# Patient Record
Sex: Female | Born: 1985 | Race: Black or African American | Hispanic: No | Marital: Single | Smoking: Former smoker
Health system: Southern US, Community
[De-identification: ages and names within clinical notes are randomized; demographics above are authoritative.]

## PROBLEM LIST (undated history)

## (undated) DIAGNOSIS — N730 Acute parametritis and pelvic cellulitis: Secondary | ICD-10-CM

## (undated) DIAGNOSIS — J45909 Unspecified asthma, uncomplicated: Secondary | ICD-10-CM

## (undated) DIAGNOSIS — D573 Sickle-cell trait: Secondary | ICD-10-CM

---

## 2017-04-07 DIAGNOSIS — J1089 Influenza due to other identified influenza virus with other manifestations: Secondary | ICD-10-CM | POA: Diagnosis not present

## 2018-03-01 ENCOUNTER — Ambulatory Visit (INDEPENDENT_AMBULATORY_CARE_PROVIDER_SITE_OTHER): Payer: Self-pay

## 2018-03-01 ENCOUNTER — Encounter (HOSPITAL_COMMUNITY): Payer: Self-pay | Admitting: Emergency Medicine

## 2018-03-01 ENCOUNTER — Ambulatory Visit (HOSPITAL_COMMUNITY)
Admission: EM | Admit: 2018-03-01 | Discharge: 2018-03-01 | Disposition: A | Discharge status: Home / Self Care | Payer: Medicaid Other | Attending: Family Medicine | Admitting: Family Medicine

## 2018-03-01 DIAGNOSIS — R0781 Pleurodynia: Secondary | ICD-10-CM

## 2018-03-01 HISTORY — DX: Unspecified asthma, uncomplicated: J45.909

## 2018-03-01 HISTORY — DX: Acute parametritis and pelvic cellulitis: N73.0

## 2018-03-01 HISTORY — DX: Sickle-cell trait: D57.3

## 2018-03-01 MED ORDER — NAPROXEN 375 MG PO TABS: 375.0000 mg | ORAL_TABLET | Freq: Two times a day (BID) | ORAL | 0 refills | Status: DC

## 2018-03-01 NOTE — ED Provider Notes (Signed)
[facility]   [id number] 03/01/18 Arrival Time: 1700  CC: Left sided rib pain  SUBJECTIVE: History from: patient. Shalondra Hisey is a 33 y.o. female complains of left sided rib pain that began a few weeks ago.  Denies a precipitating event or specific injury.  Denies recent cold symptoms or persistent cough.  Works in house keeping, and admits to repetitive movements.  Localizes the pain to the left lateral ribs.  Describes the pain as intermittent.  Has tried OTC medications without relief.  Symptoms are made worse at night, with deep inspiration, and cough.  Admits to mild productive cough at night.  Denies similar symptoms in the past.  Denies fever, chills, nausea, vomiting, SOB, calf pain/swelling, hormone use, recent long travel, hx of blood clot, hx of malignancy, hemoptysis, abdominal pain, changes in bowel or bladder habits, erythema, ecchymosis, effusion, weakness, numbness and tingling.      Does admit to occasional tobacco and marijuana use  ROS: As per HPI.  Past Medical History:  Diagnosis Date  . Asthma   . PID (acute pelvic inflammatory disease)   . Sickle cell trait (HCC)    History reviewed. No pertinent surgical history. Allergies  Allergen Reactions  . Penicillins    No current facility-administered medications on file prior to encounter.    No current outpatient medications on file prior to encounter.   Social History   Socioeconomic History  . Marital status: Single    Spouse name: Not on file  . Number of children: Not on file  . Years of education: Not on file  . Highest education level: Not on file  Occupational History  . Not on file  Social Needs  . Financial resource strain: Not on file  . Food insecurity:    Worry: Not on file    Inability: Not on file  . Transportation needs:    Medical: Not on file    Non-medical: Not on file  Tobacco Use  . Smoking status: Current Every Day Smoker  . Smokeless tobacco: Never Used  Substance  and Sexual Activity  . Alcohol use: Not Currently  . Drug use: Never  . Sexual activity: Not on file  Lifestyle  . Physical activity:    Days per week: Not on file    Minutes per session: Not on file  . Stress: Not on file  Relationships  . Social connections:    Talks on phone: Not on file    Gets together: Not on file    Attends religious service: Not on file    Active member of club or organization: Not on file    Attends meetings of clubs or organizations: Not on file    Relationship status: Not on file  . Intimate partner violence:    Fear of current or ex partner: Not on file    Emotionally abused: Not on file    Physically abused: Not on file    Forced sexual activity: Not on file  Other Topics Concern  . Not on file  Social History Narrative  . Not on file   Family History  Problem Relation Age of Onset  . Sickle cell anemia Mother   . CAD Mother     OBJECTIVE:  Vitals:   03/01/18 1029  BP: 126/85  Pulse: 60  Resp: 18  Temp: 98.1 F (36.7 C)  TempSrc: Temporal  SpO2: 100%    General appearance: AOx3; in no acute distress.  Head: NCAT Lungs: CTA bilaterally Heart:  RRR.  Musculoskeletal: Left ribs Inspection: Skin warm, dry, clear and intact without obvious erythema, effusion, or ecchymosis.  Palpation: NTTP; admits to symptoms with deep breath in between left lateral ribs; unable to palpate tenderness on exam Abdomen: soft, nondistended, normal active bowel sounds; nontender to palpation; no guarding Skin: warm and dry Neurologic: Ambulates without difficulty; Sensation intact about the upper/ lower extremities Psychological: alert and cooperative; normal mood and affect  DIAGNOSTIC STUDIES:  Dg Ribs Unilateral W/chest Left  Result Date: 03/01/2018 CLINICAL DATA:  Increasing left chest pain over the past month. Shortness of breath. EXAM: LEFT RIBS AND CHEST - 3+ VIEW COMPARISON:  None. FINDINGS: The lungs are clear. No pneumothorax or pleural  effusion. Heart size is normal. No rib fracture or other bony abnormality is identified. IMPRESSION: Normal exam. Electronically Signed   By: Drusilla Kanner M.D.   On: 03/01/2018 11:36     ASSESSMENT & PLAN:  1. Rib pain on left side     Meds ordered this encounter  Medications  . naproxen (NAPROSYN) 375 MG tablet    Sig: Take 1 tablet (375 mg total) by mouth 2 (two) times daily.    Dispense:  20 tablet    Refill:  0    Order Specific Question:   Supervising Provider    Answer:   Eustace Moore [[id number]]   X-rays did not show rib fracture, pneumonia, or fluid in the lungs.   Continue conservative management of rest, ice, and gentle stretches Take naproxen as needed for pain relief (may cause abdominal discomfort, ulcers, and GI bleeds avoid taking with other NSAIDs) Follow up with PCP in 1-2 weeks for reevaluation Return or go to the ER if you have any new or worsening symptoms (fever, chills, chest pain, abdominal pain, changes in bowel or bladder habits, pain radiating into lower legs, etc...)   Reviewed expectations re: course of current medical issues. Questions answered. Outlined signs and symptoms indicating need for more acute intervention. Patient verbalized understanding. After Visit Summary given.    Rennis Harding, PA-C 03/01/18 1241

## 2018-03-01 NOTE — ED Notes (Signed)
Patient able to ambulate independently  

## 2018-03-01 NOTE — ED Triage Notes (Signed)
Pt here for pain in left rib area x 1 month worse with inspiration and cough

## 2018-03-01 NOTE — Discharge Instructions (Signed)
X-rays did not show rib fracture, pneumonia, or fluid in the lungs.   Continue conservative management of rest, ice, and gentle stretches Take naproxen as needed for pain relief (may cause abdominal discomfort, ulcers, and GI bleeds avoid taking with other NSAIDs) Follow up with PCP in 1-2 weeks for reevaluation Return or go to the ER if you have any new or worsening symptoms (fever, chills, chest pain, abdominal pain, changes in bowel or bladder habits, pain radiating into lower legs, etc...)

## 2018-03-15 ENCOUNTER — Encounter (HOSPITAL_COMMUNITY): Payer: Self-pay | Admitting: Emergency Medicine

## 2018-03-15 ENCOUNTER — Ambulatory Visit (HOSPITAL_COMMUNITY)
Admission: EM | Admit: 2018-03-15 | Discharge: 2018-03-15 | Disposition: A | Discharge status: Home / Self Care | Payer: Self-pay | Attending: Emergency Medicine | Admitting: Emergency Medicine

## 2018-03-15 ENCOUNTER — Ambulatory Visit (INDEPENDENT_AMBULATORY_CARE_PROVIDER_SITE_OTHER): Payer: Self-pay

## 2018-03-15 DIAGNOSIS — R05 Cough: Secondary | ICD-10-CM

## 2018-03-15 DIAGNOSIS — B9789 Other viral agents as the cause of diseases classified elsewhere: Secondary | ICD-10-CM

## 2018-03-15 DIAGNOSIS — J069 Acute upper respiratory infection, unspecified: Secondary | ICD-10-CM

## 2018-03-15 DIAGNOSIS — J209 Acute bronchitis, unspecified: Secondary | ICD-10-CM

## 2018-03-15 MED ORDER — PREDNISONE 20 MG PO TABS: 20.0000 mg | ORAL_TABLET | Freq: Two times a day (BID) | ORAL | 0 refills | Status: AC

## 2018-03-15 MED ORDER — CETIRIZINE-PSEUDOEPHEDRINE ER 5-120 MG PO TB12: 1.0000 | ORAL_TABLET | Freq: Every day | ORAL | 0 refills | Status: DC

## 2018-03-15 MED ORDER — BENZONATATE 100 MG PO CAPS: 100.0000 mg | ORAL_CAPSULE | Freq: Three times a day (TID) | ORAL | 0 refills | Status: DC

## 2018-03-15 MED ORDER — FLUTICASONE PROPIONATE 50 MCG/ACT NA SUSP: 2.0000 | Freq: Every day | NASAL | 0 refills | Status: DC

## 2018-03-15 NOTE — Discharge Instructions (Addendum)
X-rays did not show signs of cardiopulmonary disease, however, based on symptoms and physical exam findings I will treat you for potential viral bronchitis Get plenty of rest and push fluids Prednisone prescribed.  Take as directed and to completion Tessalon Perles prescribed for cough Zyrtec-D prescribed for nasal congestion, runny nose, and/or sore throat Flonase prescribed for nasal congestion and runny nose Use medications daily for symptom relief Use OTC medications like ibuprofen or tylenol as needed fever or pain Follow up with PCP or [facility] if symptoms persist Return or go to ER if you have any new or worsening symptoms fever, chills, nausea, vomiting, chest pain, cough, shortness of breath, wheezing, abdominal pain, changes in bowel or bladder habits, etc..Marland Kitchen

## 2018-03-15 NOTE — ED Provider Notes (Signed)
[facility]   [id number] 03/15/18 Arrival Time: 0831   CC: URI symptoms   SUBJECTIVE: History from: patient.  Jacqueline Schmidt is a 33 y.o. female who presents with abrupt onset of runny nose, cough, and chills x few days.  Admits to sick exposure to daughter with runny nose.  Has tried OTC medications without relief.  Denies previous symptoms in the past.   Denies fever, fatigue, sinus pain, sore throat, SOB, wheezing, chest pain, nausea, changes in bowel or bladder habits.    Received flu shot this year: no.  ROS: As per HPI.  Past Medical History:  Diagnosis Date  . Asthma   . PID (acute pelvic inflammatory disease)   . Sickle cell trait (HCC)    History reviewed. No pertinent surgical history. Allergies  Allergen Reactions  . Penicillins    No current facility-administered medications on file prior to encounter.    Current Outpatient Medications on File Prior to Encounter  Medication Sig Dispense Refill  . naproxen (NAPROSYN) 375 MG tablet Take 1 tablet (375 mg total) by mouth 2 (two) times daily. 20 tablet 0   Social History   Socioeconomic History  . Marital status: Single    Spouse name: Not on file  . Number of children: Not on file  . Years of education: Not on file  . Highest education level: Not on file  Occupational History  . Not on file  Social Needs  . Financial resource strain: Not on file  . Food insecurity:    Worry: Not on file    Inability: Not on file  . Transportation needs:    Medical: Not on file    Non-medical: Not on file  Tobacco Use  . Smoking status: Current Every Day Smoker  . Smokeless tobacco: Never Used  Substance and Sexual Activity  . Alcohol use: Not Currently  . Drug use: Never  . Sexual activity: Not on file  Lifestyle  . Physical activity:    Days per week: Not on file    Minutes per session: Not on file  . Stress: Not on file  Relationships  . Social connections:    Talks on phone: Not on file    Gets  together: Not on file    Attends religious service: Not on file    Active member of club or organization: Not on file    Attends meetings of clubs or organizations: Not on file    Relationship status: Not on file  . Intimate partner violence:    Fear of current or ex partner: Not on file    Emotionally abused: Not on file    Physically abused: Not on file    Forced sexual activity: Not on file  Other Topics Concern  . Not on file  Social History Narrative  . Not on file   Family History  Problem Relation Age of Onset  . Sickle cell anemia Mother   . CAD Mother     OBJECTIVE:  Vitals:   03/15/18 0905  BP: 135/88  Pulse: 85  Resp: 16  Temp: 99.7 F (37.6 C)  TempSrc: Oral  SpO2: 100%     General appearance: alert; appears mildly fatigued, but nontoxic; speaking in full sentences and tolerating own secretions HEENT: NCAT; Ears: EACs clear, TMs pearly gray; Eyes: PERRL.  EOM grossly intact. Nose: nares patent without rhinorrhea, Throat: oropharynx clear, tonsils non erythematous or enlarged, uvula midline  Neck: supple without LAD Lungs: Rhonchi/wheezes heard throughout bilateral lungs;  mild cough Heart: regular rate and rhythm.  Radial pulses 2+ symmetrical bilaterally Skin: warm and dry Psychological: alert and cooperative; normal mood and affect  DIAGNOSTIC STUDIES:  Dg Chest 2 View  Result Date: 03/15/2018 CLINICAL DATA:  Cough and fever EXAM: CHEST - 2 VIEW COMPARISON:  03/01/2018 FINDINGS: Heart size is normal. Mediastinal shadows are normal. The lungs are clear. No bronchial thickening. No infiltrate, mass, effusion or collapse. Pulmonary vascularity is normal. No bony abnormality. IMPRESSION: Normal chest Electronically Signed   By: Paulina Fusi M.D.   On: 03/15/2018 10:28    ASSESSMENT & PLAN:  1. Acute bronchitis, unspecified organism   2. Viral URI with cough     Meds ordered this encounter  Medications  . predniSONE (DELTASONE) 20 MG tablet    Sig:  Take 1 tablet (20 mg total) by mouth 2 (two) times daily with a meal for 5 days.    Dispense:  10 tablet    Refill:  0    Order Specific Question:   Supervising Provider    Answer:   Eustace Moore [[id number]]  . cetirizine-pseudoephedrine (ZYRTEC-D) 5-120 MG tablet    Sig: Take 1 tablet by mouth daily.    Dispense:  30 tablet    Refill:  0    Order Specific Question:   Supervising Provider    Answer:   Eustace Moore [[id number]]  . fluticasone (FLONASE) 50 MCG/ACT nasal spray    Sig: Place 2 sprays into both nostrils daily.    Dispense:  16 g    Refill:  0    Order Specific Question:   Supervising Provider    Answer:   Eustace Moore [[id number]]  . benzonatate (TESSALON) 100 MG capsule    Sig: Take 1 capsule (100 mg total) by mouth every 8 (eight) hours.    Dispense:  21 capsule    Refill:  0    Order Specific Question:   Supervising Provider    Answer:   Eustace Moore [[id number]]   X-rays did not show signs of cardiopulmonary disease, however, based on symptoms and physical exam findings I will treat you for potential viral bronchitis Get plenty of rest and push fluids Prednisone prescribed.  Take as directed and to completion Tessalon Perles prescribed for cough Zyrtec-D prescribed for nasal congestion, runny nose, and/or sore throat Flonase prescribed for nasal congestion and runny nose Use medications daily for symptom relief Use OTC medications like ibuprofen or tylenol as needed fever or pain Follow up with PCP or [facility] if symptoms persist Return or go to ER if you have any new or worsening symptoms fever, chills, nausea, vomiting, chest pain, cough, shortness of breath, wheezing, abdominal pain, changes in bowel or bladder habits, etc...  Reviewed expectations re: course of current medical issues. Questions answered. Outlined signs and symptoms indicating need for more acute intervention. Patient verbalized understanding. After Visit Summary  given.         Rennis Harding, PA-C 03/15/18 1249

## 2018-03-15 NOTE — ED Notes (Signed)
Patient able to ambulate independently  

## 2018-03-15 NOTE — ED Triage Notes (Signed)
Pt presents to [facility] for assessment of left rib pain x 2 weeks (seen previously for the same) and also achy bones, cough, congestion, and low-grade fevers x 3 days.

## 2020-07-08 ENCOUNTER — Ambulatory Visit: Payer: Self-pay

## 2020-07-08 NOTE — Telephone Encounter (Signed)
Pt. Tested positive for COVID 19 with home test today. Symptoms started 07/05/20. Reviewed home treatment and quarantine recommendations. Will try e-visit through [facility].  Reason for Disposition . [1] [id number] diagnosed by positive lab test (e.g., PCR, rapid self-test kit) AND [2] mild symptoms (e.g., cough, fever, others) AND [9] no complications or SOB  Answer Assessment - Initial Assessment Questions 1. COVID-19 DIAGNOSIS: "Who made your COVID-19 diagnosis?" "Was it confirmed by a positive lab test or self-test?" If not diagnosed by a doctor (or NP/PA), ask "Are there lots of cases (community spread) where you live?" Note: See public health department website, if unsure.     Home 2. COVID-19 EXPOSURE: "Was there any known exposure to COVID before the symptoms began?" CDC Definition of close contact: within 6 feet (2 meters) for a total of 15 minutes or more over a 24-hour period.      No 3. ONSET: "When did the COVID-19 symptoms start?"      07/05/20 4. WORST SYMPTOM: "What is your worst symptom?" (e.g., cough, fever, shortness of breath, muscle aches)     Cough 5. COUGH: "Do you have a cough?" If Yes, ask: "How bad is the cough?"       Yes 6. FEVER: "Do you have a fever?" If Yes, ask: "What is your temperature, how was it measured, and when did it start?"     No 7. RESPIRATORY STATUS: "Describe your breathing?" (e.g., shortness of breath, wheezing, unable to speak)      No 8. BETTER-SAME-WORSE: "Are you getting better, staying the same or getting worse compared to yesterday?"  If getting worse, ask, "In what way?"     Better 9. HIGH RISK DISEASE: "Do you have any chronic medical problems?" (e.g., asthma, heart or lung disease, weak immune system, obesity, etc.)     Asthma 10. VACCINE: "Have you had the COVID-19 vaccine?" If Yes, ask: "Which one, how many shots, when did you get it?"       Yes 11. BOOSTER: "Have you received your COVID-19 booster?" If Yes, ask: "Which one and when did  you get it?"       Yes 12. PREGNANCY: "Is there any chance you are pregnant?" "When was your last menstrual period?"       No 13. OTHER SYMPTOMS: "Do you have any other symptoms?"  (e.g., chills, fatigue, headache, loss of smell or taste, muscle pain, sore throat)       Diarrhea 14. O2 SATURATION MONITOR:  "Do you use an oxygen saturation monitor (pulse oximeter) at home?" If Yes, ask "What is your reading (oxygen level) today?" "What is your usual oxygen saturation reading?" (e.g., 95%)       No  Protocols used: CORONAVIRUS (COVID-19) DIAGNOSED OR SUSPECTED-A-AH

## 2020-07-29 ENCOUNTER — Encounter (HOSPITAL_COMMUNITY): Payer: Self-pay | Admitting: *Deleted

## 2020-07-29 ENCOUNTER — Encounter (HOSPITAL_COMMUNITY): Payer: Self-pay

## 2020-07-29 ENCOUNTER — Other Ambulatory Visit: Payer: Self-pay

## 2020-07-29 ENCOUNTER — Ambulatory Visit (HOSPITAL_COMMUNITY)
Admission: EM | Admit: 2020-07-29 | Discharge: 2020-07-29 | Disposition: A | Discharge status: Home / Self Care | Payer: Medicaid Other

## 2020-07-29 ENCOUNTER — Emergency Department (HOSPITAL_COMMUNITY)
Admission: EM | Admit: 2020-07-29 | Discharge: 2020-07-29 | Disposition: A | Discharge status: Home / Self Care | Payer: Medicaid Other | Attending: Emergency Medicine | Admitting: Emergency Medicine

## 2020-07-29 DIAGNOSIS — R1013 Epigastric pain: Secondary | ICD-10-CM | POA: Diagnosis not present

## 2020-07-29 DIAGNOSIS — R1110 Vomiting, unspecified: Secondary | ICD-10-CM | POA: Insufficient documentation

## 2020-07-29 DIAGNOSIS — R109 Unspecified abdominal pain: Secondary | ICD-10-CM | POA: Insufficient documentation

## 2020-07-29 DIAGNOSIS — R197 Diarrhea, unspecified: Secondary | ICD-10-CM

## 2020-07-29 DIAGNOSIS — R112 Nausea with vomiting, unspecified: Secondary | ICD-10-CM

## 2020-07-29 DIAGNOSIS — Z5321 Procedure and treatment not carried out due to patient leaving prior to being seen by health care provider: Secondary | ICD-10-CM | POA: Insufficient documentation

## 2020-07-29 LAB — POCT URINALYSIS DIPSTICK, ED / UC
Bilirubin Urine: NEGATIVE
Glucose, UA: NEGATIVE mg/dL
Leukocytes,Ua: NEGATIVE
Nitrite: NEGATIVE
Protein, ur: 30 mg/dL — AB
Specific Gravity, Urine: >=1.03 (ref 1.005–1.030)
Urobilinogen, UA: 0.2 mg/dL (ref 0.0–1.0)
pH: 5.5 (ref 5.0–8.0)

## 2020-07-29 LAB — CBC WITH DIFFERENTIAL/PLATELET
Abs Immature Granulocytes: 0.03 10*3/uL (ref 0.00–0.07)
Basophils Absolute: 0 10*3/uL (ref 0.0–0.1)
Basophils Relative: 0 %
Eosinophils Absolute: 0 10*3/uL (ref 0.0–0.5)
Eosinophils Relative: 0 %
HCT: 40.2 % (ref 36.0–46.0)
Hemoglobin: 13.4 g/dL (ref 12.0–15.0)
Immature Granulocytes: 0 %
Lymphocytes Relative: 16 %
Lymphs Abs: 1.8 10*3/uL (ref 0.7–4.0)
MCH: 26.8 pg (ref 26.0–34.0)
MCHC: 33.3 g/dL (ref 30.0–36.0)
MCV: 80.4 fL (ref 80.0–100.0)
Monocytes Absolute: 0.3 10*3/uL (ref 0.1–1.0)
Monocytes Relative: 3 %
Neutro Abs: 9 10*3/uL — ABNORMAL HIGH (ref 1.7–7.7)
Neutrophils Relative %: 81 %
Platelets: 774 10*3/uL — ABNORMAL HIGH (ref 150–400)
RBC: 5 MIL/uL (ref 3.87–5.11)
RDW: 14.6 % (ref 11.5–15.5)
WBC: 11.1 10*3/uL — ABNORMAL HIGH (ref 4.0–10.5)
nRBC: 0 % (ref 0.0–0.2)

## 2020-07-29 LAB — COMPREHENSIVE METABOLIC PANEL
ALT: 13 U/L (ref 0–44)
AST: 20 U/L (ref 15–41)
Albumin: 4 g/dL (ref 3.5–5.0)
Alkaline Phosphatase: 37 U/L — ABNORMAL LOW (ref 38–126)
Anion gap: 17 — ABNORMAL HIGH (ref 5–15)
BUN: 13 mg/dL (ref 6–20)
CO2: 21 mmol/L — ABNORMAL LOW (ref 22–32)
Calcium: 9.6 mg/dL (ref 8.9–10.3)
Chloride: 103 mmol/L (ref 98–111)
Creatinine, Ser: 0.63 mg/dL (ref 0.44–1.00)
GFR, Estimated: >60 mL/min (ref >60)
Glucose, Bld: 142 mg/dL — ABNORMAL HIGH (ref 70–99)
Potassium: 3.5 mmol/L (ref 3.5–5.1)
Sodium: 141 mmol/L (ref 135–145)
Total Bilirubin: 0.6 mg/dL (ref 0.3–1.2)
Total Protein: 7.7 g/dL (ref 6.5–8.1)

## 2020-07-29 LAB — I-STAT BETA HCG BLOOD, ED (MC, WL, AP ONLY): I-stat hCG, quantitative: <5 m[IU]/mL (ref <5)

## 2020-07-29 LAB — LIPASE, BLOOD: Lipase: 27 U/L (ref 11–51)

## 2020-07-29 MED ORDER — DICYCLOMINE HCL 10 MG/5ML PO SOLN: 10.0000 mg | Freq: Once | ORAL | Status: DC

## 2020-07-29 MED ORDER — ALUM & MAG HYDROXIDE-SIMETH 200-200-20 MG/5ML PO SUSP: 30.0000 mL | Freq: Once | ORAL | Status: DC

## 2020-07-29 MED ORDER — ONDANSETRON 4 MG PO TBDP
4.0000 mg | ORAL_TABLET | Freq: Once | ORAL | Status: AC
  Administered 2020-07-29: 4 mg via ORAL
  Filled 2020-07-29: qty 1

## 2020-07-29 MED ORDER — METOCLOPRAMIDE HCL 5 MG/ML IJ SOLN
10.0000 mg | Freq: Once | INTRAMUSCULAR | Status: AC
  Administered 2020-07-29: 10 mg via INTRAMUSCULAR

## 2020-07-29 NOTE — Discharge Instructions (Addendum)
Go to ED with new or worsening symptoms. Follow up with PCP for referral to hematology

## 2020-07-29 NOTE — ED Notes (Signed)
Pt called 3x no answer  

## 2020-07-29 NOTE — ED Provider Notes (Addendum)
[facility]    CSN: [id number] Arrival date & time: 07/29/20  0907      History   Chief Complaint Chief Complaint  Patient presents with   Abdominal Pain   Emesis   dark stools    HPI Jacqueline Schmidt is a 35 y.o. female.   Patient here c/w vomiting x 1 day.  She went to ED Last night but LWBS due to wait time.  She states pain is worse, 10/10 epigastric pain, does not radiate.  PMH thrombocytosis, platelets at ED last night 774.  She does not see hematology.  Admits diarrhea, some dark stools.  Denies dysuria, frequency, urgency, vaginal d/c.  ED did hcg last night which was negative.   Past Medical History:  Diagnosis Date   Asthma    PID (acute pelvic inflammatory disease)    Sickle cell trait (HCC)     There are no problems to display for this patient.   History reviewed. No pertinent surgical history.  OB History   No obstetric history on file.      Home Medications    Prior to Admission medications   Medication Sig Start Date End Date Taking? Authorizing Provider  Acetaminophen (TYLENOL PO) Take by mouth.   Yes [provider]  benzonatate (TESSALON) 100 MG capsule Take 1 capsule (100 mg total) by mouth every 8 (eight) hours. 03/15/18   Wurst, [place], PA-C  cetirizine-pseudoephedrine (ZYRTEC-D) 5-120 MG tablet Take 1 tablet by mouth daily. 03/15/18   Wurst, [place], PA-C  fluticasone (FLONASE) 50 MCG/ACT nasal spray Place 2 sprays into both nostrils daily. 03/15/18   Wurst, [place], PA-C  naproxen (NAPROSYN) 375 MG tablet Take 1 tablet (375 mg total) by mouth 2 (two) times daily. 03/01/18   Rennis Harding, PA-C    Family History Family History  Problem Relation Age of Onset   Sickle cell anemia Mother    CAD Mother     Social History Social History   Tobacco Use   Smoking status: Every Day    Packs/day: 1.00    Pack years: 0.00    Types: Cigarettes   Smokeless tobacco: Never  Substance Use Topics   Alcohol use: Yes     Comment: reports bottle of liquor lasts the month   Drug use: Never     Allergies   Penicillins   Review of Systems Review of Systems  Constitutional:  Negative for chills, fatigue and fever.  Eyes:  Negative for pain and redness.  Respiratory:  Negative for cough, shortness of breath and wheezing.   Gastrointestinal:  Positive for abdominal pain, diarrhea, nausea and vomiting.  Genitourinary:  Negative for dysuria, frequency, hematuria, urgency, vaginal bleeding, vaginal discharge and vaginal pain.  Musculoskeletal:  Negative for arthralgias and myalgias.  Skin:  Negative for rash.  Neurological:  Negative for light-headedness and headaches.  Hematological:  Negative for adenopathy. Does not bruise/bleed easily.  Psychiatric/Behavioral:  Negative for confusion and sleep disturbance.     Physical Exam Triage Vital Signs ED Triage Vitals  Enc Vitals Group     BP 07/29/20 0934 (!) 160/78     Pulse Rate 07/29/20 0934 (!) 50     Resp 07/29/20 0934 20     Temp 07/29/20 0934 98.5 F (36.9 C)     Temp src --      SpO2 07/29/20 0934 98 %     Weight --      Height --      Head Circumference --  Peak Flow --      Pain Score 07/29/20 0930 10     Pain Loc --      Pain Edu? --      Excl. in GC? --    No data found.  Updated Vital Signs BP (!) 160/78   Pulse (!) 50   Temp 98.5 F (36.9 C)   Resp 20   LMP 07/26/2020   SpO2 98%   Visual Acuity Right Eye Distance:   Left Eye Distance:   Bilateral Distance:    Right Eye Near:   Left Eye Near:    Bilateral Near:     Physical Exam Vitals and nursing note reviewed.  Constitutional:      General: She is not in acute distress.    Appearance: She is well-developed. She is not ill-appearing.  HENT:     Head: Normocephalic and atraumatic.  Eyes:     Extraocular Movements: Extraocular movements intact.     Conjunctiva/sclera: Conjunctivae normal.  Cardiovascular:     Rate and Rhythm: Normal rate and regular rhythm.      Heart sounds: No murmur heard. Pulmonary:     Effort: Pulmonary effort is normal. No respiratory distress.     Breath sounds: Normal breath sounds.  Abdominal:     Palpations: Abdomen is soft.     Tenderness: There is abdominal tenderness in the epigastric area. There is no right CVA tenderness, left CVA tenderness or guarding. Negative signs include Murphy's sign and McBurney's sign.  Musculoskeletal:     Cervical back: Neck supple.  Skin:    General: Skin is warm and dry.  Neurological:     General: No focal deficit present.     Mental Status: She is alert and oriented to person, place, and time.     Motor: No weakness.     Gait: Gait normal.  Psychiatric:        Mood and Affect: Mood normal.        Behavior: Behavior normal.     UC Treatments / Results  Labs (all labs ordered are listed, but only abnormal results are displayed) Labs Reviewed  POCT URINALYSIS DIPSTICK, ED / UC - Abnormal; Notable for the following components:      Result Value   Ketones, ur TRACE (*)    Hgb urine dipstick MODERATE (*)    Protein, ur 30 (*)    All other components within normal limits    EKG   Radiology No results found.  Procedures Procedures (including critical care time)  Medications Ordered in UC Medications  metoCLOPramide (REGLAN) injection 10 mg (10 mg Intramuscular Given 07/29/20 1028)    Initial Impression / Assessment and Plan / UC Course  I have reviewed the triage vital signs and the nursing notes.  Pertinent labs & imaging results that were available during my care of the patient were reviewed by me and considered in my medical decision making (see chart for details).     Patient reports improvement after injection of reglan.   Drink clear liquids, small volumes all day long Eat crackers and toast, advance diet as tolerated Strict ED precautions provided - discussed returning to ED, however she stated she did not wish to go due to wait.  She stated she will  return to ED if sx don't improve or worsen. Final Clinical Impressions(s) / UC Diagnoses   Final diagnoses:  Nausea vomiting and diarrhea  Epigastric pain     Discharge Instructions  Go to ED with new or worsening symptoms. Follow up with PCP for referral to hematology     ED Prescriptions   None    PDMP not reviewed this encounter.   Evern Core, PA-C 07/29/20 1118    Evern Core, PA-C 07/29/20 1144

## 2020-07-29 NOTE — ED Provider Notes (Signed)
Emergency Medicine Provider Triage Evaluation Note  Jacqueline Schmidt , a 35 y.o. female  was evaluated in triage.  Pt complains of vomiting.  The patient endorses multiple episodes of vomiting and upper abdominal pain, onset today.  She has been feeling more constipated and generally weak.  Last bowel movement was earlier today.  She endorses chills and tactile fever.  No dysuria, vaginal bleeding, discharge, chest pain, shortness of breath.  She tested positive for COVID-19 on May 31.  No concerns for pregnancy.  Review of Systems  Positive: Vomiting, abdominal pain, weakness Negative: Dysuria, vaginal bleeding, vaginal discharge, chest pain, shortness of breath  Physical Exam  There were no vitals taken for this visit. Gen:   Awake, no distress, actively retching Resp:  Normal effort MSK:   Moves extremities without difficulty Other:  Tender to palpation in the epigastric region.  Abdomen is soft and nondistended.  Medical Decision Making  Medically screening exam initiated at 3:06 AM.  Appropriate orders placed.  Tenaya Hilyer was informed that the remainder of the evaluation will be completed by another provider, this initial triage assessment does not replace that evaluation, and the importance of remaining in the ED until their evaluation is complete.  Patient with abdominal pain and vomiting, onset today.  She is actively retching in triage.  Zofran administered.  Labs are pending.  She will require further work-up and evaluation in the emergency department.   Barkley Boards, PA-C 07/29/20 0324    Shon Baton, MD 07/29/20 [phone]

## 2020-07-29 NOTE — ED Triage Notes (Signed)
Pt reports abd pain with n/v. Denies diarrhea, states that she feels constipated. N/v noted at triage, given zofran.

## 2020-07-29 NOTE — ED Notes (Signed)
PT has been call several times for vitals also registration  and has not answered.

## 2020-07-29 NOTE — ED Triage Notes (Signed)
Pt reports severe abdominal pain starting yesterday with emesis. States since yesterday unable to keep down food or liquid. Reports dark stools.

## 2020-07-30 ENCOUNTER — Emergency Department (HOSPITAL_COMMUNITY)
Admission: EM | Admit: 2020-07-30 | Discharge: 2020-07-30 | Disposition: A | Discharge status: Home / Self Care | Payer: Medicaid Other | Attending: Emergency Medicine | Admitting: Emergency Medicine

## 2020-07-30 ENCOUNTER — Other Ambulatory Visit: Payer: Self-pay

## 2020-07-30 ENCOUNTER — Emergency Department (HOSPITAL_COMMUNITY): Payer: Medicaid Other

## 2020-07-30 ENCOUNTER — Encounter (HOSPITAL_COMMUNITY): Payer: Self-pay | Admitting: *Deleted

## 2020-07-30 DIAGNOSIS — R1084 Generalized abdominal pain: Secondary | ICD-10-CM | POA: Diagnosis not present

## 2020-07-30 DIAGNOSIS — D75839 Thrombocytosis, unspecified: Secondary | ICD-10-CM | POA: Insufficient documentation

## 2020-07-30 DIAGNOSIS — F17210 Nicotine dependence, cigarettes, uncomplicated: Secondary | ICD-10-CM | POA: Insufficient documentation

## 2020-07-30 DIAGNOSIS — R1013 Epigastric pain: Secondary | ICD-10-CM | POA: Diagnosis present

## 2020-07-30 DIAGNOSIS — J45909 Unspecified asthma, uncomplicated: Secondary | ICD-10-CM | POA: Insufficient documentation

## 2020-07-30 DIAGNOSIS — R1110 Vomiting, unspecified: Secondary | ICD-10-CM | POA: Diagnosis not present

## 2020-07-30 DIAGNOSIS — R102 Pelvic and perineal pain: Secondary | ICD-10-CM | POA: Diagnosis not present

## 2020-07-30 LAB — CBC WITH DIFFERENTIAL/PLATELET
Abs Immature Granulocytes: 0.01 10*3/uL (ref 0.00–0.07)
Basophils Absolute: 0 10*3/uL (ref 0.0–0.1)
Basophils Relative: 0 %
Eosinophils Absolute: 0 10*3/uL (ref 0.0–0.5)
Eosinophils Relative: 0 %
HCT: 40.9 % (ref 36.0–46.0)
Hemoglobin: 13.8 g/dL (ref 12.0–15.0)
Immature Granulocytes: 0 %
Lymphocytes Relative: 32 %
Lymphs Abs: 3.3 10*3/uL (ref 0.7–4.0)
MCH: 26.3 pg (ref 26.0–34.0)
MCHC: 33.7 g/dL (ref 30.0–36.0)
MCV: 77.9 fL — ABNORMAL LOW (ref 80.0–100.0)
Monocytes Absolute: 0.6 10*3/uL (ref 0.1–1.0)
Monocytes Relative: 6 %
Neutro Abs: 6.4 10*3/uL (ref 1.7–7.7)
Neutrophils Relative %: 62 %
Platelets: 909 10*3/uL (ref 150–400)
RBC: 5.25 MIL/uL — ABNORMAL HIGH (ref 3.87–5.11)
RDW: 14.1 % (ref 11.5–15.5)
WBC: 10.3 10*3/uL (ref 4.0–10.5)
nRBC: 0 % (ref 0.0–0.2)

## 2020-07-30 LAB — COMPREHENSIVE METABOLIC PANEL
ALT: 14 U/L (ref 0–44)
AST: 18 U/L (ref 15–41)
Albumin: 4.7 g/dL (ref 3.5–5.0)
Alkaline Phosphatase: 43 U/L (ref 38–126)
Anion gap: 10 (ref 5–15)
BUN: 15 mg/dL (ref 6–20)
CO2: 26 mmol/L (ref 22–32)
Calcium: 9.7 mg/dL (ref 8.9–10.3)
Chloride: 101 mmol/L (ref 98–111)
Creatinine, Ser: 0.71 mg/dL (ref 0.44–1.00)
GFR, Estimated: >60 mL/min (ref >60)
Glucose, Bld: 100 mg/dL — ABNORMAL HIGH (ref 70–99)
Potassium: 3 mmol/L — ABNORMAL LOW (ref 3.5–5.1)
Sodium: 137 mmol/L (ref 135–145)
Total Bilirubin: 0.7 mg/dL (ref 0.3–1.2)
Total Protein: 8.7 g/dL — ABNORMAL HIGH (ref 6.5–8.1)

## 2020-07-30 LAB — LIPASE, BLOOD: Lipase: 28 U/L (ref 11–51)

## 2020-07-30 LAB — I-STAT BETA HCG BLOOD, ED (MC, WL, AP ONLY): I-stat hCG, quantitative: <5 m[IU]/mL (ref <5)

## 2020-07-30 MED ORDER — IOHEXOL 300 MG/ML  SOLN
100.0000 mL | Freq: Once | INTRAMUSCULAR | Status: AC | PRN
  Administered 2020-07-30: 100 mL via INTRAVENOUS

## 2020-07-30 MED ORDER — SODIUM CHLORIDE 0.9 % IV BOLUS
1000.0000 mL | Freq: Once | INTRAVENOUS | Status: AC
  Administered 2020-07-30: 1000 mL via INTRAVENOUS

## 2020-07-30 MED ORDER — SODIUM CHLORIDE (PF) 0.9 % IJ SOLN
INTRAMUSCULAR | Status: AC
  Filled 2020-07-30: qty 50

## 2020-07-30 MED ORDER — DICYCLOMINE HCL 20 MG PO TABS: 20.0000 mg | ORAL_TABLET | Freq: Two times a day (BID) | ORAL | 0 refills | Status: DC

## 2020-07-30 MED ORDER — ONDANSETRON HCL 4 MG/2ML IJ SOLN
4.0000 mg | Freq: Once | INTRAMUSCULAR | Status: AC
  Administered 2020-07-30: 4 mg via INTRAVENOUS
  Filled 2020-07-30: qty 2

## 2020-07-30 MED ORDER — ONDANSETRON 4 MG PO TBDP: 4.0000 mg | ORAL_TABLET | Freq: Three times a day (TID) | ORAL | 0 refills | Status: DC | PRN

## 2020-07-30 MED ORDER — FENTANYL CITRATE (PF) 100 MCG/2ML IJ SOLN
50.0000 ug | Freq: Once | INTRAMUSCULAR | Status: AC
  Administered 2020-07-30: 50 ug via INTRAVENOUS
  Filled 2020-07-30: qty 2

## 2020-07-30 NOTE — ED Provider Notes (Signed)
[facility] EMERGENCY DEPARTMENT Provider Note  CSN: [id number] Arrival date & time: 07/30/20 1539    History Chief Complaint  Patient presents with   Abdominal Pain     Abdominal Pain  Jacqueline Schmidt is a 35 y.o. female with history of thrombocytosis reports 2 days of persistent epigastric pain, now spread to her entire abdomen and into her L flank. Associated with multiple episodes of vomiting. She has not had any hematemesis or hematochezia/melena. She reports she went to the [facility] ED but left due to wait times. She went to UC yesterday and was given IM reglan which helped for a while but symptoms returned today and so she came to the ED for re-evaluation. She denies any history of similar symptoms. Does not drink on a regular basis. Has not established with local Hematology since moving here several years ago, but has been advised to take a daily ASA in the past which she does with some trepidation due to reading about gastric side effects.    Past Medical History:  Diagnosis Date   Asthma    PID (acute pelvic inflammatory disease)    Sickle cell trait (HCC)     History reviewed. No pertinent surgical history.  Family History  Problem Relation Age of Onset   Sickle cell anemia Mother    CAD Mother     Social History   Tobacco Use   Smoking status: Every Day    Packs/day: 1.00    Pack years: 0.00    Types: Cigarettes   Smokeless tobacco: Never  Substance Use Topics   Alcohol use: Yes    Comment: reports bottle of liquor lasts the month   Drug use: Never     Home Medications Prior to Admission medications   Medication Sig Start Date End Date Taking? Authorizing Provider  dicyclomine (BENTYL) 20 MG tablet Take 1 tablet (20 mg total) by mouth 2 (two) times daily. 07/30/20  Yes Pollyann Savoy, MD  ondansetron (ZOFRAN ODT) 4 MG disintegrating tablet Take 1 tablet (4 mg total) by mouth every 8 (eight) hours as needed for nausea or vomiting. 07/30/20  Yes Pollyann Savoy, MD  Acetaminophen (TYLENOL PO) Take by mouth.    [provider]  benzonatate (TESSALON) 100 MG capsule Take 1 capsule (100 mg total) by mouth every 8 (eight) hours. 03/15/18   Wurst, [place], PA-C  cetirizine-pseudoephedrine (ZYRTEC-D) 5-120 MG tablet Take 1 tablet by mouth daily. 03/15/18   Wurst, [place], PA-C  fluticasone (FLONASE) 50 MCG/ACT nasal spray Place 2 sprays into both nostrils daily. 03/15/18   Wurst, [place], PA-C  naproxen (NAPROSYN) 375 MG tablet Take 1 tablet (375 mg total) by mouth 2 (two) times daily. 03/01/18   Wurst, [place], PA-C     Allergies    Penicillins   Review of Systems   Review of Systems  Gastrointestinal:  Positive for abdominal pain.  A comprehensive review of systems was completed and negative except as noted in HPI.    Physical Exam BP (!) 134/93 (BP Location: Right Arm)   Pulse 70   Temp 98.7 F (37.1 C) (Oral)   Resp 16   LMP 07/28/2020   SpO2 100%   Physical Exam Vitals and nursing note reviewed.  Constitutional:      Appearance: Normal appearance.  HENT:     Head: Normocephalic and atraumatic.     Nose: Nose normal.     Mouth/Throat:     Mouth: Mucous membranes are moist.  Eyes:  Extraocular Movements: Extraocular movements intact.     Conjunctiva/sclera: Conjunctivae normal.  Cardiovascular:     Rate and Rhythm: Normal rate.  Pulmonary:     Effort: Pulmonary effort is normal.     Breath sounds: Normal breath sounds.  Abdominal:     General: Abdomen is flat.     Palpations: Abdomen is soft.     Tenderness: There is generalized abdominal tenderness and tenderness in the epigastric area. There is no guarding. Negative signs include Murphy's sign and McBurney's sign.  Musculoskeletal:        General: No swelling. Normal range of motion.     Cervical back: Neck supple.  Skin:    General: Skin is warm and dry.  Neurological:     General: No focal deficit present.     Mental Status: She is alert.   Psychiatric:        Mood and Affect: Mood normal.     ED Results / Procedures / Treatments   Labs (all labs ordered are listed, but only abnormal results are displayed) Labs Reviewed  COMPREHENSIVE METABOLIC PANEL - Abnormal; Notable for the following components:      Result Value   Potassium 3.0 (*)    Glucose, Bld 100 (*)    Total Protein 8.7 (*)    All other components within normal limits  CBC WITH DIFFERENTIAL/PLATELET - Abnormal; Notable for the following components:   RBC 5.25 (*)    MCV 77.9 (*)    Platelets 909 (*)    All other components within normal limits  LIPASE, BLOOD  URINALYSIS, ROUTINE W REFLEX MICROSCOPIC  I-STAT BETA HCG BLOOD, ED ([facility], [facility], [facility] ONLY)    EKG None  Radiology CT Abdomen Pelvis W Contrast  Result Date: 07/30/2020 CLINICAL DATA:  35 year old female with epigastric pain. EXAM: CT ABDOMEN AND PELVIS WITH CONTRAST TECHNIQUE: Multidetector CT imaging of the abdomen and pelvis was performed using the standard protocol following bolus administration of intravenous contrast. CONTRAST:  OMNIPAQUE IOHEXOL 300 MG/ML  SOLN COMPARISON:  None. FINDINGS: Lower chest: The visualized lung bases are clear. No intra-abdominal free air. No free fluid. Hepatobiliary: No focal liver abnormality is seen. No gallstones, gallbladder wall thickening, or biliary dilatation. Pancreas: Unremarkable. No pancreatic ductal dilatation or surrounding inflammatory changes. Spleen: Subcentimeter hypodense lesion in the spleen may represent a small hemangioma or cyst versus interspersed fat. Adrenals/Urinary Tract: The adrenal glands unremarkable. The kidneys, visualized ureters, and urinary bladder appear unremarkable. Stomach/Bowel: Evaluation of the bowel is limited in the absence of oral contrast. There is inflammatory changes of the distal small bowel and terminal ileum consistent with enteritis and suspicious for inflammatory bowel disease. Correlation with history of Crohn's  disease recommended. There is no bowel obstruction. The appendix is normal. Vascular/Lymphatic: The abdominal aorta and IVC are unremarkable. No portal venous gas. There is no adenopathy. Reproductive: The uterus is anteverted and grossly unremarkable. No adnexal masses. Mildly dilated left gonadal vein. Other: None Musculoskeletal: Mild bilateral sacroiliitis. No acute osseous pathology. IMPRESSION: Inflammatory changes of the distal small bowel and terminal ileum suspicious for Crohn's disease. Clinical correlation is recommended. No bowel obstruction. Normal appendix. Electronically Signed   By: Elgie Collard M.D.   On: 07/30/2020 21:38    Procedures Procedures  Medications Ordered in the ED Medications  sodium chloride (PF) 0.9 % injection (has no administration in time range)  fentaNYL (SUBLIMAZE) injection 50 mcg (50 mcg Intravenous Given 07/30/20 2055)  ondansetron (ZOFRAN) injection 4 mg (4 mg  Intravenous Given 07/30/20 2053)  sodium chloride 0.9 % bolus 1,000 mL (1,000 mLs Intravenous New Bag/Given 07/30/20 2054)  iohexol (OMNIPAQUE) 300 MG/ML solution 100 mL (100 mLs Intravenous Contrast Given 07/30/20 2118)     MDM Rules/Calculators/A&P MDM  Patient with diffuse abdominal pain, labs are unremarkable except for mild hypokalemia. Will give pain/nausea meds, IVF and send for CT.   ED Course  I have reviewed the triage vital signs and the nursing notes.  Pertinent labs & imaging results that were available during my care of the patient were reviewed by me and considered in my medical decision making (see chart for details).  Clinical Course as of 07/30/20 2150  Wed Jul 30, 2020  2147 Patient reports pain and nausea are significantly improved. She is still having some aching in her left flank but no pain or tenderness in her RLQ near the area of inflammation seen on CT. Recommend outpatient GI follow up. Referral to Hematology for her PLT. Rx for zofran and bentyl for pain relief.   [CS]    Clinical Course User Index [CS] Pollyann Savoy, MD    Final Clinical Impression(s) / ED Diagnoses Final diagnoses:  Generalized abdominal pain  Thrombocytosis    Rx / DC Orders ED Discharge Orders          Ordered    ondansetron (ZOFRAN ODT) 4 MG disintegrating tablet  Every 8 hours PRN        07/30/20 2150    dicyclomine (BENTYL) 20 MG tablet  2 times daily        07/30/20 2150             Pollyann Savoy, MD 07/30/20 2150

## 2020-07-30 NOTE — ED Triage Notes (Signed)
Pt complains of abdominal pain and emesis x 2 days, diarrhea since this morning. Unable to keep food down. Last smoke marijuana 2 days ago.

## 2020-07-30 NOTE — ED Provider Notes (Signed)
Emergency Medicine Provider Triage Evaluation Note  Jacqueline Schmidt 35 y.o. female was evaluated in triage.  Pt complains of presents for evaluation of generalized abdominal pain that is been ongoing for about 3 days.  She reports associated nausea/vomiting.  She is also had episodes of diarrhea.  She denies any fevers, urinary complaints.  She went to a walk-in clinic today and was sent to the ED for further evaluation.  She does not store smoking marijuana and states last time she smoked was 3 days ago.   Review of Systems  Positive: Abdominal pain, nausea/vomiting/diarrhea Negative: Fevers, urinary complaints   Physical Exam  BP 134/82   Pulse 70   Temp 98.2 F (36.8 C) (Oral)   Resp 18   Ht 5\' 4"  (1.626 m)   Wt 65.8 kg   SpO2 100%   BMI 24.89 kg/m  Gen:   Awake, no distress   HEENT:  Atraumatic  Resp:  Normal effort  Cardiac:  Normal rate  Abd:   Nondistended, diffuse tenderness palpation.  No rigidity, guarding.  No CVA tenderness. MSK:   Moves extremities without difficulty  Neuro:  Speech clear   Other:      Medical Decision Making  Medically screening exam initiated at 4:01 PM  Appropriate orders placed.  Jacqueline Schmidt was informed that the remainder of the evaluation will be completed by another provider, this initial triage assessment does not replace that evaluation. They are counseled that they will need to remain in the ED until the completion of their workup, including full H&P and results of any tests.  Risks of leaving the emergency department prior to completion of treatment were discussed. Patient was advised to inform ED staff if they are leaving before their treatment is complete. The patient acknowledged these risks and time was allowed for questions.     The patient appears stable so that the remainder of the MSE may be completed by another provider.    Clinical Impression  Abd pain   Portions of this note were generated with Dragon dictation software.  Dictation errors may occur despite best attempts at proofreading.     Isac Sarna, PA-C 07/30/20 1602    08/01/20, MD 07/30/20 [phone]

## 2020-07-30 NOTE — ED Notes (Signed)
The Patient has refuse to the Nurse Tech to do her Vitals.

## 2020-07-30 NOTE — ED Notes (Signed)
Asked pt if she could provide urine sample, pt said not right now, will try again later

## 2020-07-31 ENCOUNTER — Telehealth: Payer: Self-pay | Admitting: Physician Assistant

## 2020-07-31 NOTE — Telephone Encounter (Signed)
Received a new hem referral from [facility] for thrombocytopenia. Jacqueline Schmidt has been cld and scheduled to see Jacqueline Schmidt on 6/27 at 1:30pm w./labs at 1pm. Pt aware to arrive 15 minutes early.

## 2020-07-31 NOTE — Progress Notes (Signed)
[place] [phone]   [phone]  CONSULT NOTE  REFERRING PHYSICIAN: [facility]   REASON FOR CONSULTATION:  Thrombocytosis   HPI Jacqueline Schmidt is a 35 y.o. female with no significant past medical history except for asthma and suspected Crohn's Disease is referred to the clinic for evaluation of thrombocytosis.    Per chart review, it appears that the patient was referred to the clinic by her primary care physician in April 2022 although she was unable to establish care at that time. Their note references that her platelet count was 813 prior and she was supposed to be taking 81 mg aspirin. The patient moved here from [place] 4 years ago, and she reports she has had elevated platelets for the last 12-13 years. She states this was first discovered while she was pregnant, and she has four children. She was then referred to a hematologist in [place] at that time and reports having a bone marrow biopsy. She states they could not tell her a cause of the thrombocytosis and advised she tale a baby Asprin daily. The patient denies a history of iron deficiency anemia although she reports having iron transfusion only during pregnancies. She did not follow up with anyone after delivering her children with the youngest one being 11 years old. She has not been taking daily Asprin during that time period as states she is not good with pills, but has recently started taking it again in the last 2 days. She plans to continue the medication daily.   Last week, the patient presented to the emergency room on 07/29/2020 with a chief complaint of abdominal pain, chills, vomiting, and possible fever and marijuana use 2 days prior.  The patient left the ER without being seen.  Of note, the patient tested for COVID-19 on Jul 08, 2020.  The patient then presented again to the emergency room on 07/30/2020 for the same concerns.  She had a CT scan of the abdomen pelvis performed  which showed inflammatory changes of the distal small bowel and terminal ileum suspicious for Crohn's disease.  Her platelet count was 774k on 07/29/2020 and 909K on 07/30/2020. She was given dicyclomine to take twice daily and instructed to follow up with GI.  It was noted that she should follow up with hematology for her thrombocytosis.   Regarding the patient's history with thrombocytosis, she denies spleen removal. She denies recent blood loss. Denies infection. Denies recent trauma. Denies abdnomal bleeding/bruising including epistaxis, ginigal bleeding, hematochezia, melena, or hemoptysis. She denies any flushing. She does report some itching after a hot shower as well as some possible erythromelgaia. She denies unexplained weight loss. 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 She has had recent chills and sweating when in the ED and only surrounding recent GI issues. She was unable to eat during that time, but has started eating again since Friday. She denies any fever or night sweats. She denies any history of thrombosis. She denies any bleeding or bruising. She reports an intermittent numbness along her entire right arm. This has been going on for a long time, and she states she will raise and "shake out" her arm to help relieve it. She has some intermittent tingling in right fingertips. She states she is unsure if this could be due to her work and caring for her children as possibly carpal tunnel or her platelets. She denies  any history of diabetes. She reports having frequent headaches that have also been going on for a long time for which she takes medication OTC for relief. She denies any new medication use.    The patient has a smoking history of 1.5 packs per day for the past 18 years although she reports recently quitting for the past week. She has nicotine patches available and has no plans to resume smoking. She  reports occasional alcohol use and states 1 bottle of alcohol could last her a whole month. She reports using marijuana but denies any other substance use. She is currently single and works cleaning houses.   Her family history is significant for one son and mother having sickle cell disease. Her mother also has arthritis, reflux, and has had open heart surgery and valve repair. She has one child with asthma. She denies any family history of bone marrow disorders, or cancers.  HPI  Past Medical History:  Diagnosis Date   Asthma    PID (acute pelvic inflammatory disease)    Sickle cell trait ([place] [facility])     No past surgical history on file.  Family History  Problem Relation Age of Onset   Sickle cell anemia Mother    CAD Mother     Social History Social History   Tobacco Use   Smoking status: Former    Packs/day: 1.00    Pack years: 0.00    Types: Cigarettes    Quit date: 07/28/2020    Years since quitting: 0.0   Smokeless tobacco: Never  Substance Use Topics   Alcohol use: Yes    Comment: reports bottle of liquor lasts the month   Drug use: Never    Allergies  Allergen Reactions   Penicillins     Current Outpatient Medications  Medication Sig Dispense Refill   dicyclomine (BENTYL) 20 MG tablet Take 1 tablet (20 mg total) by mouth 2 (two) times daily. 20 tablet 0   ondansetron (ZOFRAN ODT) 4 MG disintegrating tablet Take 1 tablet (4 mg total) by mouth every 8 (eight) hours as needed for nausea or vomiting. 20 tablet 0   No current facility-administered medications for this visit.    REVIEW OF SYSTEMS:   Review of Systems  Constitutional: Positive for fatigue and chills. Negative for appetite change, fever, and unexpected weight change.  HENT:   Negative for mouth sores, nosebleeds, sore throat and trouble swallowing.   Eyes: Negative for eye problems and icterus.  Respiratory: Negative for cough, hemoptysis, shortness of breath and wheezing.   Cardiovascular: Negative  for chest pain and leg swelling.  Gastrointestinal: Negative for abdominal pain, constipation, diarrhea, nausea and vomiting.  Genitourinary: Negative for bladder incontinence, difficulty urinating, dysuria, frequency and hematuria.   Musculoskeletal: Negative for back pain, gait problem, neck pain and neck stiffness.  Skin: Negative for itching and rash.  Neurological: Positive headaches. Negative for dizziness, extremity weakness, gait problem, light-headedness and seizures.  Hematological: Negative for adenopathy. Does not bruise/bleed easily.  Psychiatric/Behavioral: Negative for confusion, depression and sleep disturbance. The patient is not nervous/anxious.     PHYSICAL EXAMINATION:  Blood pressure 128/84, pulse (!) 53, temperature (!) 97.3 F (36.3 C), temperature source Tympanic, resp. rate 18, weight 121 lb 4.8 oz (55 kg), last menstrual period 07/28/2020, SpO2 99 %.  ECOG PERFORMANCE STATUS: 1  Physical Exam  Constitutional: Oriented to person, place, and time and well-developed, well-nourished, and in no distress.  HENT:  Head: Normocephalic and atraumatic.  Mouth/Throat: Oropharynx is clear  and moist. No oropharyngeal exudate.  Eyes: Conjunctivae are normal. Right eye exhibits no discharge. Left eye exhibits no discharge. No scleral icterus.  Neck: Normal range of motion. Neck supple.  Cardiovascular: Normal rate, regular rhythm, normal heart sounds and intact distal pulses.   Pulmonary/Chest: Effort normal and breath sounds normal. No respiratory distress. No wheezes. No rales.  Abdominal: Soft. Bowel sounds are normal. Exhibits no distension and no mass. There is no tenderness.  Musculoskeletal: Normal range of motion. Exhibits no edema.  Lymphadenopathy:    No cervical adenopathy.  Neurological: Alert and oriented to person, place, and time. Exhibits normal muscle tone. Gait normal. Coordination normal.  Skin: Skin is warm and dry. No rash noted. Not diaphoretic. No  erythema. No pallor.  Psychiatric: Mood, memory and judgment normal.  Vitals reviewed.  LABORATORY DATA: Lab Results  Component Value Date   WBC 5.8 08/04/2020   HGB 11.3 (L) 08/04/2020   HCT 33.5 (L) 08/04/2020   MCV 78.8 (L) 08/04/2020   PLT 736 (H) 08/04/2020      Chemistry      Component Value Date/Time   NA 140 08/04/2020 1249   K 3.5 08/04/2020 1249   CL 107 08/04/2020 1249   CO2 25 08/04/2020 1249   BUN 10 08/04/2020 1249   CREATININE 0.69 08/04/2020 1249      Component Value Date/Time   CALCIUM 8.5 (L) 08/04/2020 1249   ALKPHOS 40 08/04/2020 1249   AST 14 (L) 08/04/2020 1249   ALT 7 08/04/2020 1249   BILITOT <0.2 (L) 08/04/2020 1249       RADIOGRAPHIC STUDIES: CT Abdomen Pelvis W Contrast  Result Date: 07/30/2020 CLINICAL DATA:  35 year old female with epigastric pain. EXAM: CT ABDOMEN AND PELVIS WITH CONTRAST TECHNIQUE: Multidetector CT imaging of the abdomen and pelvis was performed using the standard protocol following bolus administration of intravenous contrast. CONTRAST:  137m OMNIPAQUE IOHEXOL 300 MG/ML  SOLN COMPARISON:  None. FINDINGS: Lower chest: The visualized lung bases are clear. No intra-abdominal free air. No free fluid. Hepatobiliary: No focal liver abnormality is seen. No gallstones, gallbladder wall thickening, or biliary dilatation. Pancreas: Unremarkable. No pancreatic ductal dilatation or surrounding inflammatory changes. Spleen: Subcentimeter hypodense lesion in the spleen may represent a small hemangioma or cyst versus interspersed fat. Adrenals/Urinary Tract: The adrenal glands unremarkable. The kidneys, visualized ureters, and urinary bladder appear unremarkable. Stomach/Bowel: Evaluation of the bowel is limited in the absence of oral contrast. There is inflammatory changes of the distal small bowel and terminal ileum consistent with enteritis and suspicious for inflammatory bowel disease. Correlation with history of Crohn's disease recommended.  There is no bowel obstruction. The appendix is normal. Vascular/Lymphatic: The abdominal aorta and IVC are unremarkable. No portal venous gas. There is no adenopathy. Reproductive: The uterus is anteverted and grossly unremarkable. No adnexal masses. Mildly dilated left gonadal vein. Other: None Musculoskeletal: Mild bilateral sacroiliitis. No acute osseous pathology. IMPRESSION: Inflammatory changes of the distal small bowel and terminal ileum suspicious for Crohn's disease. Clinical correlation is recommended. No bowel obstruction. Normal appendix. Electronically Signed   By: AAnner CreteM.D.   On: 07/30/2020 21:38    ASSESSMENT: This is a very pleasant 35year old female referred to the clinic for thrombocytosis.   The patient had a repeat CBC, CMP, LDH, ferritin, iron studies, and JAK2 molecular studies.   The CBC showed a normal WBC count, slightly low Hbg at 11.3. Her MCV is low at 78.8. Her platelet count is elevated at 736k.  Her CMP is unremarkable. Her Ferritin is 100. Her Iron studies show mild iron deficiency.   She was encouraged to take a iron supplement 1-2x daily. Advised to take this with orange juice to aid with absorption.  The patient will be evaluated for a myeloproliferative disorder with her ongoing thrombocytosis.  A JAK2, CALR, and MPL panel has been sent, and the patient will be called with the results and follow up instructions will be given based on the results. If positive, we may need to start her on medication. If negative, it may be due to iron deficiency and would recommend continuing iron supplementation.   I will personally call the patient with the results and follow up instructions.   The patient voices understanding of current disease status and treatment options and is in agreement with the current care plan.  All questions were answered. The patient knows to call the clinic with any problems, questions or concerns. We can certainly see the patient much  sooner if necessary.  Thank you so much for allowing me to participate in the care of Jacqueline Schmidt. I will continue to follow up the patient with you and assist in her care.   Disclaimer: This note was dictated with voice recognition software. Similar sounding words can inadvertently be transcribed and may not be corrected upon review.   Kirkland Figg L Barbar Brede August 04, 2020, 3:21 PM  ADDENDUM: Hematology/Oncology Attending: I had a face-to-face encounter with the patient today.  I reviewed her records, lab and recommended her care plan.  This is a very pleasant 35 years old African-American female with history of asthma and suspected Crohn's disease who was referred to [place] today for evaluation of thrombocytosis.  The patient moved to [place] from [place] 4 years ago.  She mentioned that she had similar problem when she was in [place] and she was supposed to undergo additional studies that included a bone marrow biopsy and aspirate but according to the patient was not conclusive for the etiology of her condition.  She was advised by her hematologist at that time to take baby aspirin on daily basis.  The patient also has a history of iron deficiency anemia and required iron infusion during her pregnancy.  She does not take oral iron tablet at regular basis.  She was seen recently at the emergency department and because of the elevated platelets count she was referred to [place] today for evaluation and recommendation regarding her condition.  The patient otherwise is feeling fine with no concerning complaints.  She has sickle cell trait she is currently on treatment with dicyclomine for the suspicious Crohn's disease and she was referred to gastroenterology for further evaluation. I had a lengthy discussion with the patient today about her condition and further investigation to confirm her diagnosis.  We will order several studies today including repeat CBC which showed persistent thrombocytosis with  platelets count of 736,000.  The patient also has mild anemia with hemoglobin of 11.3 and hematocrit 33.5 with MCV of 78.8%.  Ferritin level was normal at 100 but the patient has low serum iron of 39 with iron saturation of 16%.  We also order molecular studies for JAK2 mutation panel. We advised the patient to take oral iron tablet at regular basis and we will wait for the final result of the JAK2 mutation panel to determine if the patient has essential thrombocythemia or reactive thrombocytosis. If she has essential thrombocythemia, will consider The patient for treatment with hydroxyurea but if she has  reactive thrombocytosis, she will continue with the oral iron tablet and routine follow-up visit by her primary care physician. The patient was advised to call immediately if she has any other concerning symptoms in the interval. The total time spent in the appointment was 60 minutes.  Disclaimer: This note was dictated with voice recognition software. Similar sounding words can inadvertently be transcribed and may be missed upon review. Eilleen Kempf, MD 08/04/20

## 2020-08-04 ENCOUNTER — Other Ambulatory Visit: Payer: Self-pay

## 2020-08-04 ENCOUNTER — Other Ambulatory Visit: Payer: Self-pay | Admitting: Physician Assistant

## 2020-08-04 ENCOUNTER — Inpatient Hospital Stay: Payer: Medicaid Other

## 2020-08-04 ENCOUNTER — Inpatient Hospital Stay: Payer: Medicaid Other | Attending: Physician Assistant | Admitting: Physician Assistant

## 2020-08-04 ENCOUNTER — Encounter: Payer: Self-pay | Admitting: Physician Assistant

## 2020-08-04 DIAGNOSIS — D75839 Thrombocytosis, unspecified: Secondary | ICD-10-CM

## 2020-08-04 DIAGNOSIS — Z87891 Personal history of nicotine dependence: Secondary | ICD-10-CM | POA: Diagnosis not present

## 2020-08-04 DIAGNOSIS — D573 Sickle-cell trait: Secondary | ICD-10-CM | POA: Diagnosis not present

## 2020-08-04 DIAGNOSIS — J45909 Unspecified asthma, uncomplicated: Secondary | ICD-10-CM

## 2020-08-04 DIAGNOSIS — Z832 Family history of diseases of the blood and blood-forming organs and certain disorders involving the immune mechanism: Secondary | ICD-10-CM

## 2020-08-04 DIAGNOSIS — Z8616 Personal history of COVID-19: Secondary | ICD-10-CM | POA: Diagnosis not present

## 2020-08-04 DIAGNOSIS — D649 Anemia, unspecified: Secondary | ICD-10-CM | POA: Diagnosis not present

## 2020-08-04 LAB — CBC WITH DIFFERENTIAL (CANCER CENTER ONLY)
Abs Immature Granulocytes: 0 10*3/uL (ref 0.00–0.07)
Basophils Absolute: 0 10*3/uL (ref 0.0–0.1)
Basophils Relative: 1 %
Eosinophils Absolute: 0.2 10*3/uL (ref 0.0–0.5)
Eosinophils Relative: 3 %
HCT: 33.5 % — ABNORMAL LOW (ref 36.0–46.0)
Hemoglobin: 11.3 g/dL — ABNORMAL LOW (ref 12.0–15.0)
Immature Granulocytes: 0 %
Lymphocytes Relative: 33 %
Lymphs Abs: 2 10*3/uL (ref 0.7–4.0)
MCH: 26.6 pg (ref 26.0–34.0)
MCHC: 33.7 g/dL (ref 30.0–36.0)
MCV: 78.8 fL — ABNORMAL LOW (ref 80.0–100.0)
Monocytes Absolute: 0.4 10*3/uL (ref 0.1–1.0)
Monocytes Relative: 7 %
Neutro Abs: 3.3 10*3/uL (ref 1.7–7.7)
Neutrophils Relative %: 56 %
Platelet Count: 736 10*3/uL — ABNORMAL HIGH (ref 150–400)
RBC: 4.25 MIL/uL (ref 3.87–5.11)
RDW: 14.1 % (ref 11.5–15.5)
WBC Count: 5.8 10*3/uL (ref 4.0–10.5)
nRBC: 0 % (ref 0.0–0.2)

## 2020-08-04 LAB — CMP (CANCER CENTER ONLY)
ALT: 7 U/L (ref 0–44)
AST: 14 U/L — ABNORMAL LOW (ref 15–41)
Albumin: 3.6 g/dL (ref 3.5–5.0)
Alkaline Phosphatase: 40 U/L (ref 38–126)
Anion gap: 8 (ref 5–15)
BUN: 10 mg/dL (ref 6–20)
CO2: 25 mmol/L (ref 22–32)
Calcium: 8.5 mg/dL — ABNORMAL LOW (ref 8.9–10.3)
Chloride: 107 mmol/L (ref 98–111)
Creatinine: 0.69 mg/dL (ref 0.44–1.00)
GFR, Estimated: >60 mL/min (ref >60)
Glucose, Bld: 81 mg/dL (ref 70–99)
Potassium: 3.5 mmol/L (ref 3.5–5.1)
Sodium: 140 mmol/L (ref 135–145)
Total Bilirubin: <0.2 mg/dL — ABNORMAL LOW (ref 0.3–1.2)
Total Protein: 6.7 g/dL (ref 6.5–8.1)

## 2020-08-04 LAB — IRON AND TIBC
Iron: 39 ug/dL — ABNORMAL LOW (ref 41–142)
Saturation Ratios: 16 % — ABNORMAL LOW (ref 21–57)
TIBC: 249 ug/dL (ref 236–444)
UIBC: 210 ug/dL (ref 120–384)

## 2020-08-04 LAB — FERRITIN: Ferritin: 100 ng/mL (ref 11–307)

## 2020-08-04 LAB — LACTATE DEHYDROGENASE: LDH: 138 U/L (ref 98–192)

## 2020-08-12 LAB — JAK2 (INCLUDING V617F AND EXON 12), MPL,& CALR W/RFL MPN PANEL (NGS)

## 2020-08-15 ENCOUNTER — Telehealth: Payer: Self-pay | Admitting: Internal Medicine

## 2020-08-15 ENCOUNTER — Telehealth: Payer: Self-pay | Admitting: Physician Assistant

## 2020-08-15 DIAGNOSIS — D75839 Thrombocytosis, unspecified: Secondary | ICD-10-CM

## 2020-08-15 NOTE — Telephone Encounter (Signed)
Scheduled appt per 7/8 sch msg. Pt aware.  

## 2020-08-15 NOTE — Telephone Encounter (Signed)
I called the patient to let her know that her JAK2 testing was negative. I also reviewed with Dr. Arbutus Ped who recommended continuing to take iron and having a lab and follow up in 3 months. The patient is taking aspirin but not taking iron because she is concerned about constipation and she wants to wait to see what her gastroenterologist will say when she sees them on 7/13. I discussed if she is concerned about constipation, other options would be to take a slow release iron, prescription iron, or taking a stool softener. I have sent a scheduling message for her follow up in 3 months.

## 2020-11-10 ENCOUNTER — Inpatient Hospital Stay: Payer: Medicaid Other | Attending: Internal Medicine

## 2020-11-10 ENCOUNTER — Inpatient Hospital Stay (HOSPITAL_BASED_OUTPATIENT_CLINIC_OR_DEPARTMENT_OTHER): Payer: Medicaid Other | Admitting: Internal Medicine

## 2020-11-10 ENCOUNTER — Other Ambulatory Visit: Payer: Self-pay

## 2020-11-10 VITALS — BP 136/90 | HR 63 | Temp 96.2°F | Resp 18 | Wt 130.8 lb

## 2020-11-10 DIAGNOSIS — D75838 Other thrombocytosis: Secondary | ICD-10-CM | POA: Diagnosis not present

## 2020-11-10 DIAGNOSIS — D75839 Thrombocytosis, unspecified: Secondary | ICD-10-CM

## 2020-11-10 DIAGNOSIS — E611 Iron deficiency: Secondary | ICD-10-CM | POA: Diagnosis not present

## 2020-11-10 DIAGNOSIS — K5090 Crohn's disease, unspecified, without complications: Secondary | ICD-10-CM | POA: Diagnosis not present

## 2020-11-10 LAB — CBC WITH DIFFERENTIAL (CANCER CENTER ONLY)
Abs Immature Granulocytes: 0.01 10*3/uL (ref 0.00–0.07)
Basophils Absolute: 0.1 10*3/uL (ref 0.0–0.1)
Basophils Relative: 1 %
Eosinophils Absolute: 0.2 10*3/uL (ref 0.0–0.5)
Eosinophils Relative: 3 %
HCT: 36.7 % (ref 36.0–46.0)
Hemoglobin: 12.3 g/dL (ref 12.0–15.0)
Immature Granulocytes: 0 %
Lymphocytes Relative: 35 %
Lymphs Abs: 2.4 10*3/uL (ref 0.7–4.0)
MCH: 27.2 pg (ref 26.0–34.0)
MCHC: 33.5 g/dL (ref 30.0–36.0)
MCV: 81 fL (ref 80.0–100.0)
Monocytes Absolute: 0.5 10*3/uL (ref 0.1–1.0)
Monocytes Relative: 7 %
Neutro Abs: 3.6 10*3/uL (ref 1.7–7.7)
Neutrophils Relative %: 54 %
Platelet Count: 669 10*3/uL — ABNORMAL HIGH (ref 150–400)
RBC: 4.53 MIL/uL (ref 3.87–5.11)
RDW: 14.3 % (ref 11.5–15.5)
WBC Count: 6.8 10*3/uL (ref 4.0–10.5)
nRBC: 0 % (ref 0.0–0.2)

## 2020-11-10 LAB — IRON AND TIBC
Iron: 54 ug/dL (ref 41–142)
Saturation Ratios: 21 % (ref 21–57)
TIBC: 261 ug/dL (ref 236–444)
UIBC: 207 ug/dL (ref 120–384)

## 2020-11-10 LAB — FERRITIN: Ferritin: 69 ng/mL (ref 11–307)

## 2020-11-10 NOTE — Progress Notes (Signed)
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  JAK2 mutation panel was negative.  PRIOR THERAPY: None  CURRENT THERAPY: None.  INTERVAL HISTORY: Jacqueline Schmidt 35 y.o. female returns to the clinic today for follow-up visit.  The patient is feeling fine today with no concerning complaints.  She denied having any current chest pain, shortness of breath, cough or hemoptysis.  She was in a tapered dose of prednisone for treatment of her Crohn's disease which was discontinued.  The patient was advised to start taking over the counter iron tablet but she could not tolerate it and her gastroenterologist told her not to take it because of the Crohn's.  She denied having any current nausea, vomiting, diarrhea or constipation.  She has no headache or visual changes.  She had molecular studies for JAK2 mutation panel that was reported to be negative.  The patient is here today for evaluation and repeat blood work.  MEDICAL HISTORY: Past Medical History:  Diagnosis Date   Asthma    PID (acute pelvic inflammatory disease)    Sickle cell trait ([facility])     ALLERGIES:  is allergic to penicillins.  MEDICATIONS:  Current Outpatient Medications  Medication Sig Dispense Refill   dicyclomine (BENTYL) 20 MG tablet Take 1 tablet (20 mg total) by mouth 2 (two) times daily. 20 tablet 0   ondansetron (ZOFRAN ODT) 4 MG disintegrating tablet Take 1 tablet (4 mg total) by mouth every 8 (eight) hours as needed for nausea or vomiting. 20 tablet 0   No current facility-administered medications for this visit.    SURGICAL HISTORY: No past surgical history on file.  REVIEW OF SYSTEMS:  A comprehensive review of systems was negative.   PHYSICAL EXAMINATION: General appearance: alert, cooperative, and no  distress Head: Normocephalic, without obvious abnormality, atraumatic Neck: no adenopathy, no JVD, supple, symmetrical, trachea midline, and thyroid not enlarged, symmetric, no tenderness/mass/nodules Lymph nodes: Cervical, supraclavicular, and axillary nodes normal. Resp: clear to auscultation bilaterally Back: symmetric, no curvature. ROM normal. No CVA tenderness. Cardio: regular rate and rhythm, S1, S2 normal, no murmur, click, rub or gallop GI: soft, non-tender; bowel sounds normal; no masses,  no organomegaly Extremities: extremities normal, atraumatic, no cyanosis or edema  ECOG PERFORMANCE STATUS: 0 - Asymptomatic  Blood pressure 136/90, pulse 63, temperature (!) 96.2 F (35.7 C), temperature source Tympanic, resp. rate 18, weight 130 lb 12.8 oz (59.3 kg), SpO2 100 %.  LABORATORY DATA: Lab Results  Component Value Date   WBC 6.8 11/10/2020   HGB 12.3 11/10/2020   HCT 36.7 11/10/2020   MCV 81.0 11/10/2020   PLT 669 (H) 11/10/2020      Chemistry      Component Value Date/Time   NA 140 08/04/2020 1249   K 3.5 08/04/2020 1249   CL 107 08/04/2020 1249   CO2 25 08/04/2020 1249   BUN 10 08/04/2020 1249   CREATININE 0.69 08/04/2020 1249      Component Value Date/Time   CALCIUM 8.5 (L) 08/04/2020 1249   ALKPHOS 40 08/04/2020 1249   AST 14 (L) 08/04/2020 1249   ALT 7 08/04/2020 1249   BILITOT <0.2 (L) 08/04/2020 1249       RADIOGRAPHIC STUDIES: No results found.  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  The patient was treated with a steroid for her Crohn's disease which also was a contributing factor for her significant thrombocytosis but she has been tapered from the steroid treatment recently. Repeat CBC today showed decrease in the platelet count but still elevated compared to the normal range.  This is again still consistent with a  reactive condition secondary to the Crohn's disease and iron deficiency. I recommended for her to increase the iron rich diet. I will see her back for follow-up visit in 6 months for evaluation with repeat CBC, iron study and ferritin. The patient was advised to call immediately if she has any other concerning symptoms in the interval. The patient voices understanding of current disease status and treatment options and is in agreement with the current care plan.  All questions were answered. The patient knows to call the clinic with any problems, questions or concerns. We can certainly see the patient much sooner if necessary. The total time spent in the appointment was 20 minutes.  Disclaimer: This note was dictated with voice recognition software. Similar sounding words can inadvertently be transcribed and may not be corrected upon review.

## 2020-11-11 ENCOUNTER — Telehealth: Payer: Self-pay | Admitting: Internal Medicine

## 2020-11-11 NOTE — Telephone Encounter (Signed)
Scheduled appt per 10/3 los - patient is aware of appt date and time. Called and confirmed

## 2021-05-11 ENCOUNTER — Inpatient Hospital Stay: Payer: Medicaid Other

## 2021-05-11 ENCOUNTER — Other Ambulatory Visit: Payer: Self-pay

## 2021-05-11 ENCOUNTER — Inpatient Hospital Stay: Payer: Medicaid Other | Attending: Internal Medicine | Admitting: Internal Medicine

## 2021-05-11 VITALS — BP 133/101 | HR 67 | Temp 97.9°F | Resp 18 | Wt 138.1 lb

## 2021-05-11 DIAGNOSIS — D75839 Thrombocytosis, unspecified: Secondary | ICD-10-CM | POA: Diagnosis not present

## 2021-05-11 DIAGNOSIS — D75838 Other thrombocytosis: Secondary | ICD-10-CM | POA: Diagnosis present

## 2021-05-11 DIAGNOSIS — K5090 Crohn's disease, unspecified, without complications: Secondary | ICD-10-CM | POA: Diagnosis not present

## 2021-05-11 DIAGNOSIS — Z79899 Other long term (current) drug therapy: Secondary | ICD-10-CM | POA: Insufficient documentation

## 2021-05-11 DIAGNOSIS — E611 Iron deficiency: Secondary | ICD-10-CM | POA: Insufficient documentation

## 2021-05-11 DIAGNOSIS — D649 Anemia, unspecified: Secondary | ICD-10-CM | POA: Insufficient documentation

## 2021-05-11 LAB — IRON AND IRON BINDING CAPACITY (CC-WL,HP ONLY)
Iron: 65 ug/dL (ref 28–170)
Saturation Ratios: 22 % (ref 10.4–31.8)
TIBC: 290 ug/dL (ref 250–450)
UIBC: 225 ug/dL (ref 148–442)

## 2021-05-11 LAB — CBC WITH DIFFERENTIAL (CANCER CENTER ONLY)
Abs Immature Granulocytes: 0.01 10*3/uL (ref 0.00–0.07)
Basophils Absolute: 0.1 10*3/uL (ref 0.0–0.1)
Basophils Relative: 1 %
Eosinophils Absolute: 0.1 10*3/uL (ref 0.0–0.5)
Eosinophils Relative: 2 %
HCT: 35.5 % — ABNORMAL LOW (ref 36.0–46.0)
Hemoglobin: 11.8 g/dL — ABNORMAL LOW (ref 12.0–15.0)
Immature Granulocytes: 0 %
Lymphocytes Relative: 38 %
Lymphs Abs: 2.6 10*3/uL (ref 0.7–4.0)
MCH: 26.8 pg (ref 26.0–34.0)
MCHC: 33.2 g/dL (ref 30.0–36.0)
MCV: 80.5 fL (ref 80.0–100.0)
Monocytes Absolute: 0.4 10*3/uL (ref 0.1–1.0)
Monocytes Relative: 6 %
Neutro Abs: 3.6 10*3/uL (ref 1.7–7.7)
Neutrophils Relative %: 53 %
Platelet Count: 522 10*3/uL — ABNORMAL HIGH (ref 150–400)
RBC: 4.41 MIL/uL (ref 3.87–5.11)
RDW: 13.5 % (ref 11.5–15.5)
WBC Count: 6.8 10*3/uL (ref 4.0–10.5)
nRBC: 0 % (ref 0.0–0.2)

## 2021-05-11 LAB — FERRITIN: Ferritin: 60 ng/mL (ref 11–307)

## 2021-05-11 NOTE — Progress Notes (Signed)
?    [facility] ?[phone]   [phone] ? ?OFFICE PROGRESS NOTE ? ?Patient, No Pcp Per (Inactive) ?No address on file ? ?DIAGNOSIS: Thrombocytosis likely reactive in nature secondary to Crohn's disease and iron deficiency.  JAK2 mutation panel was negative. ? ?PRIOR THERAPY: None ? ?CURRENT THERAPY: None. ? ?INTERVAL HISTORY: ?Jacqueline Schmidt 36 y.o. female returns to the clinic today for follow-up visit.  The patient is feeling fine today with no concerning complaints.  She is currently on Humira for her Crohn's disease.  She denied having any current chest pain, shortness of breath, cough or hemoptysis.  She denied having any fever or chills.  She has no nausea, vomiting, diarrhea or constipation.  She has no headache or visual changes.  She is here today for evaluation and repeat blood work for close monitoring of her platelet count. ? ?MEDICAL HISTORY: ?Past Medical History:  ?Diagnosis Date  ? Asthma   ? PID (acute pelvic inflammatory disease)   ? Sickle cell trait (HCC)   ? ? ?ALLERGIES:  is allergic to penicillins. ? ?MEDICATIONS:  ?Current Outpatient Medications  ?Medication Sig Dispense Refill  ? Adalimumab (HUMIRA) 40 MG/0.8ML PSKT Inject 40 mg into the skin. 40 mg SQ every two weeks    ? dicyclomine (BENTYL) 20 MG tablet Take 1 tablet (20 mg total) by mouth 2 (two) times daily. (Patient not taking: Reported on 05/11/2021) 20 tablet 0  ? ?No current facility-administered medications for this visit.  ? ? ?SURGICAL HISTORY: No past surgical history on file. ? ?REVIEW OF SYSTEMS:  A comprehensive review of systems was negative.  ? ?PHYSICAL EXAMINATION: General appearance: alert, cooperative, and no distress ?Head: Normocephalic, without obvious abnormality, atraumatic ?Neck: no adenopathy, no JVD, supple, symmetrical, trachea midline, and thyroid not enlarged, symmetric, no tenderness/mass/nodules ?Lymph nodes: Cervical, supraclavicular, and axillary nodes normal. ?Resp:  clear to auscultation bilaterally ?Back: symmetric, no curvature. ROM normal. No CVA tenderness. ?Cardio: regular rate and rhythm, S1, S2 normal, no murmur, click, rub or gallop ?GI: soft, non-tender; bowel sounds normal; no masses,  no organomegaly ?Extremities: extremities normal, atraumatic, no cyanosis or edema ? ?ECOG PERFORMANCE STATUS: 0 - Asymptomatic ? ?Blood pressure (!) 133/101, pulse 67, temperature 97.9 ?F (36.6 ?C), temperature source Tympanic, resp. rate 18, weight 138 lb 1 oz (62.6 kg), SpO2 100 %. ? ?LABORATORY DATA: ?Lab Results  ?Component Value Date  ? WBC 6.8 05/11/2021  ? HGB 11.8 (L) 05/11/2021  ? HCT 35.5 (L) 05/11/2021  ? MCV 80.5 05/11/2021  ? PLT 522 (H) 05/11/2021  ? ? ?  Chemistry   ?   ?Component Value Date/Time  ? NA 140 08/04/2020 1249  ? K 3.5 08/04/2020 1249  ? CL 107 08/04/2020 1249  ? CO2 25 08/04/2020 1249  ? BUN 10 08/04/2020 1249  ? CREATININE 0.69 08/04/2020 1249  ?    ?Component Value Date/Time  ? CALCIUM 8.5 (L) 08/04/2020 1249  ? ALKPHOS 40 08/04/2020 1249  ? AST 14 (L) 08/04/2020 1249  ? ALT 7 08/04/2020 1249  ? BILITOT <0.2 (L) 08/04/2020 1249  ?  ? ? ? ?RADIOGRAPHIC STUDIES: ?No results found. ? ?ASSESSMENT AND PLAN: This is a very pleasant 36 years old African-American female with reactive thrombocytosis secondary to Crohn's disease as well as iron deficiency.  Her thrombocytosis is reactive in nature likely secondary to the inflammatory process from Crohn's disease. ?The patient is currently on observation and she is feeling fine with no concerning complaints. ?Repeat  CBC today showed further improvement and decrease in her platelets count down to 522,000. ?She also has mild anemia. ?I recommended for the patient to continue on observation and routine follow-up visit by her primary care physician from now on.  I will see her back for follow-up visit on as-needed basis if she has any significant anemia or significant platelets count over 800,000. ?The patient is in  agreement with the current plan. ?She was advised to call if she has any other concerning issues in the interval. ?The patient voices understanding of current disease status and treatment options and is in agreement with the current care plan. ? ?All questions were answered. The patient knows to call the clinic with any problems, questions or concerns. We can certainly see the patient much sooner if necessary. ?The total time spent in the appointment was 20 minutes. ? ?Disclaimer: This note was dictated with voice recognition software. Similar sounding words can inadvertently be transcribed and may not be corrected upon review. ? ? ?  ?   ?

## 2021-10-28 ENCOUNTER — Emergency Department (HOSPITAL_COMMUNITY)
Admission: EM | Admit: 2021-10-28 | Discharge: 2021-10-28 | Disposition: A | Discharge status: Home / Self Care | Payer: Medicaid Other | Attending: Emergency Medicine | Admitting: Emergency Medicine

## 2021-10-28 ENCOUNTER — Ambulatory Visit
Admission: RE | Admit: 2021-10-28 | Discharge: 2021-10-28 | Disposition: A | Discharge status: Home / Self Care | Payer: Medicaid Other | Source: Ambulatory Visit | Attending: Gastroenterology | Admitting: Gastroenterology

## 2021-10-28 ENCOUNTER — Emergency Department (HOSPITAL_COMMUNITY): Payer: Medicaid Other

## 2021-10-28 ENCOUNTER — Other Ambulatory Visit: Payer: Self-pay | Admitting: Gastroenterology

## 2021-10-28 ENCOUNTER — Encounter (HOSPITAL_COMMUNITY): Payer: Self-pay

## 2021-10-28 DIAGNOSIS — R112 Nausea with vomiting, unspecified: Secondary | ICD-10-CM

## 2021-10-28 DIAGNOSIS — R1084 Generalized abdominal pain: Secondary | ICD-10-CM

## 2021-10-28 DIAGNOSIS — K50818 Crohn's disease of both small and large intestine with other complication: Secondary | ICD-10-CM | POA: Insufficient documentation

## 2021-10-28 DIAGNOSIS — R1110 Vomiting, unspecified: Secondary | ICD-10-CM | POA: Diagnosis not present

## 2021-10-28 DIAGNOSIS — K50819 Crohn's disease of both small and large intestine with unspecified complications: Secondary | ICD-10-CM

## 2021-10-28 DIAGNOSIS — K50919 Crohn's disease, unspecified, with unspecified complications: Secondary | ICD-10-CM

## 2021-10-28 DIAGNOSIS — R197 Diarrhea, unspecified: Secondary | ICD-10-CM | POA: Diagnosis present

## 2021-10-28 LAB — COMPREHENSIVE METABOLIC PANEL
ALT: 14 U/L (ref 0–44)
AST: 17 U/L (ref 15–41)
Albumin: 4.2 g/dL (ref 3.5–5.0)
Alkaline Phosphatase: 39 U/L (ref 38–126)
Anion gap: 9 (ref 5–15)
BUN: 13 mg/dL (ref 6–20)
CO2: 22 mmol/L (ref 22–32)
Calcium: 9.4 mg/dL (ref 8.9–10.3)
Chloride: 108 mmol/L (ref 98–111)
Creatinine, Ser: 0.7 mg/dL (ref 0.44–1.00)
GFR, Estimated: >60 mL/min (ref >60)
Glucose, Bld: 107 mg/dL — ABNORMAL HIGH (ref 70–99)
Potassium: 3.3 mmol/L — ABNORMAL LOW (ref 3.5–5.1)
Sodium: 139 mmol/L (ref 135–145)
Total Bilirubin: 0.6 mg/dL (ref 0.3–1.2)
Total Protein: 7.9 g/dL (ref 6.5–8.1)

## 2021-10-28 LAB — I-STAT CHEM 8, ED
BUN: 11 mg/dL (ref 6–20)
Calcium, Ion: 1.18 mmol/L (ref 1.15–1.40)
Chloride: 103 mmol/L (ref 98–111)
Creatinine, Ser: 0.7 mg/dL (ref 0.44–1.00)
Glucose, Bld: 99 mg/dL (ref 70–99)
HCT: 43 % (ref 36.0–46.0)
Hemoglobin: 14.6 g/dL (ref 12.0–15.0)
Potassium: 3.4 mmol/L — ABNORMAL LOW (ref 3.5–5.1)
Sodium: 138 mmol/L (ref 135–145)
TCO2: 25 mmol/L (ref 22–32)

## 2021-10-28 LAB — URINALYSIS, ROUTINE W REFLEX MICROSCOPIC
Bacteria, UA: NONE SEEN
Bilirubin Urine: NEGATIVE
Glucose, UA: NEGATIVE mg/dL
Ketones, ur: 20 mg/dL — AB
Leukocytes,Ua: NEGATIVE
Nitrite: NEGATIVE
Protein, ur: NEGATIVE mg/dL
Specific Gravity, Urine: >1.046 — ABNORMAL HIGH (ref 1.005–1.030)
pH: 5 (ref 5.0–8.0)

## 2021-10-28 LAB — CBC WITH DIFFERENTIAL/PLATELET
Abs Immature Granulocytes: 0.01 10*3/uL (ref 0.00–0.07)
Basophils Absolute: 0 10*3/uL (ref 0.0–0.1)
Basophils Relative: 0 %
Eosinophils Absolute: 0 10*3/uL (ref 0.0–0.5)
Eosinophils Relative: 0 %
HCT: 40.1 % (ref 36.0–46.0)
Hemoglobin: 13.2 g/dL (ref 12.0–15.0)
Immature Granulocytes: 0 %
Lymphocytes Relative: 39 %
Lymphs Abs: 2.9 10*3/uL (ref 0.7–4.0)
MCH: 26.3 pg (ref 26.0–34.0)
MCHC: 32.9 g/dL (ref 30.0–36.0)
MCV: 80 fL (ref 80.0–100.0)
Monocytes Absolute: 0.7 10*3/uL (ref 0.1–1.0)
Monocytes Relative: 9 %
Neutro Abs: 3.7 10*3/uL (ref 1.7–7.7)
Neutrophils Relative %: 52 %
Platelets: 542 10*3/uL — ABNORMAL HIGH (ref 150–400)
RBC: 5.01 MIL/uL (ref 3.87–5.11)
RDW: 14 % (ref 11.5–15.5)
WBC: 7.3 10*3/uL (ref 4.0–10.5)
nRBC: 0 % (ref 0.0–0.2)

## 2021-10-28 LAB — C DIFFICILE QUICK SCREEN W PCR REFLEX
C Diff antigen: NEGATIVE
C Diff interpretation: NOT DETECTED
C Diff toxin: NEGATIVE

## 2021-10-28 LAB — I-STAT BETA HCG BLOOD, ED (MC, WL, AP ONLY): I-stat hCG, quantitative: <5 m[IU]/mL (ref <5)

## 2021-10-28 MED ORDER — SODIUM CHLORIDE 0.9 % IV BOLUS
1000.0000 mL | Freq: Once | INTRAVENOUS | Status: AC
  Administered 2021-10-28: 1000 mL via INTRAVENOUS

## 2021-10-28 MED ORDER — IOHEXOL 300 MG/ML  SOLN
100.0000 mL | Freq: Once | INTRAMUSCULAR | Status: AC | PRN
  Administered 2021-10-28: 100 mL via INTRAVENOUS

## 2021-10-28 MED ORDER — ONDANSETRON HCL 4 MG/2ML IJ SOLN
4.0000 mg | Freq: Once | INTRAMUSCULAR | Status: AC
  Administered 2021-10-28: 4 mg via INTRAVENOUS
  Filled 2021-10-28: qty 2

## 2021-10-28 MED ORDER — METHYLPREDNISOLONE SODIUM SUCC 125 MG IJ SOLR
60.0000 mg | Freq: Once | INTRAMUSCULAR | Status: AC
  Administered 2021-10-28: 60 mg via INTRAVENOUS
  Filled 2021-10-28: qty 2

## 2021-10-28 NOTE — ED Provider Notes (Signed)
[facility] DEPT Provider Note   CSN: [id number] Arrival date & time: 10/28/21  1720     History  Chief Complaint  Patient presents with   Diarrhea    Jacqueline Schmidt is a 36 y.o. female history of Crohn's disease on Humira here presenting with diarrhea and vomiting and possible obstruction.  Patient states that she has been having watery diarrhea for the last several days.  She also started vomiting and could not keep anything down.  She went to GI office this morning and saw Dr. Therisa Doyne.  She states that she had an x-ray that showed possible obstruction and sent in for lab work and CT scan.  Patient apparently had some stool studies done in clinic.  The history is provided by the patient.       Home Medications Prior to Admission medications   Medication Sig Start Date End Date Taking? Authorizing Provider  Adalimumab (HUMIRA) 40 MG/0.8ML PSKT Inject 40 mg into the skin. 40 mg SQ every two weeks    [provider]  dicyclomine (BENTYL) 20 MG tablet Take 1 tablet (20 mg total) by mouth 2 (two) times daily. Patient not taking: Reported on 05/11/2021 07/30/20   Truddie Hidden, MD  ondansetron (ZOFRAN-ODT) 8 MG disintegrating tablet Take 8 mg by mouth every 6 (six) hours. 10/28/21   [provider]      Allergies    Penicillins    Review of Systems   Review of Systems  Gastrointestinal:  Positive for diarrhea and vomiting.  All other systems reviewed and are negative.   Physical Exam Updated Vital Signs BP (!) 120/95   Pulse 94   Temp 98 F (36.7 C)   Resp 17   SpO2 99%  Physical Exam Vitals and nursing note reviewed.  Constitutional:      Appearance: Normal appearance.  HENT:     Head: Normocephalic.     Nose: Nose normal.     Mouth/Throat:     Mouth: Mucous membranes are moist.  Eyes:     Extraocular Movements: Extraocular movements intact.     Pupils: Pupils are equal, round, and reactive to light.   Cardiovascular:     Rate and Rhythm: Normal rate and regular rhythm.     Pulses: Normal pulses.     Heart sounds: Normal heart sounds.  Pulmonary:     Effort: Pulmonary effort is normal.     Breath sounds: Normal breath sounds.  Abdominal:     Comments: Mildly distended, mild diffuse tenderness   Musculoskeletal:        General: Normal range of motion.     Cervical back: Normal range of motion and neck supple.  Skin:    General: Skin is warm.     Capillary Refill: Capillary refill takes less than 2 seconds.  Neurological:     General: No focal deficit present.     Mental Status: She is alert and oriented to person, place, and time.  Psychiatric:        Mood and Affect: Mood normal.        Behavior: Behavior normal.     ED Results / Procedures / Treatments   Labs (all labs ordered are listed, but only abnormal results are displayed) Labs Reviewed  CBC WITH DIFFERENTIAL/PLATELET - Abnormal; Notable for the following components:      Result Value   Platelets 542 (*)    All other components within normal limits  COMPREHENSIVE METABOLIC PANEL - Abnormal;  Notable for the following components:   Potassium 3.3 (*)    Glucose, Bld 107 (*)    All other components within normal limits  URINALYSIS, ROUTINE W REFLEX MICROSCOPIC - Abnormal; Notable for the following components:   Specific Gravity, Urine >1.046 (*)    Hgb urine dipstick SMALL (*)    Ketones, ur 20 (*)    All other components within normal limits  I-STAT CHEM 8, ED - Abnormal; Notable for the following components:   Potassium 3.4 (*)    All other components within normal limits  C DIFFICILE QUICK SCREEN W PCR REFLEX    GASTROINTESTINAL PANEL BY PCR, STOOL (REPLACES STOOL CULTURE)  I-STAT BETA HCG BLOOD, ED ([facility], [facility], [facility] ONLY)    EKG None  Radiology CT ABDOMEN PELVIS W CONTRAST  Result Date: 10/28/2021 CLINICAL DATA:  History of Crohn's disease with findings suggestive of small-bowel obstruction on recent plain  film EXAM: CT ABDOMEN AND PELVIS WITH CONTRAST TECHNIQUE: Multidetector CT imaging of the abdomen and pelvis was performed using the standard protocol following bolus administration of intravenous contrast. RADIATION DOSE REDUCTION: This exam was performed according to the departmental dose-optimization program which includes automated exposure control, adjustment of the mA and/or kV according to patient size and/or use of iterative reconstruction technique. CONTRAST:  OMNIPAQUE IOHEXOL 300 MG/ML  SOLN COMPARISON:  07/30/2020, plain film from earlier in the same day. FINDINGS: Lower chest: No acute abnormality. Hepatobiliary: No focal liver abnormality is seen. No gallstones, gallbladder wall thickening, or biliary dilatation. Pancreas: Unremarkable. No pancreatic ductal dilatation or surrounding inflammatory changes. Spleen: Subcentimeter hypodensity is again identified stable from the prior exam and consistent with a benign etiology. Adrenals/Urinary Tract: Adrenal glands are within normal limits. Kidneys are well visualize within normal enhancement pattern. No renal calculi or obstructive changes are seen. The bladder is partially distended. Stomach/Bowel: Air in fluid is noted throughout the colon. No obstructive changes are seen. Changes of prior appendectomy are seen. Terminal ileum demonstrates some fatty deposition within the wall consistent with the known history of Crohn's. Small-bowel shows relative decompression when compared with the prior plain film examination. There is a mild caliber change identified in the terminal ileum best seen on image number 58 and 59 of series 2. Some mild fecalization of bowel contents is noted just proximal to this although no significant obstructive changes are noted to correspond with that seen on prior plain film. The stomach is within normal limits. Vascular/Lymphatic: No significant vascular findings are present. No enlarged abdominal or pelvic lymph nodes.  Reproductive: Uterus and bilateral adnexa are unremarkable. Other: No abdominal wall hernia or abnormality. No abdominopelvic ascites. Musculoskeletal: No acute or significant osseous findings. IMPRESSION: Changes consistent with the given clinical history of Crohn's disease with fatty deposition within the wall of the terminal ileum. Just proximal to this as described there is a caliber change identified with mild small bowel dilatation proximally as well as some fecalization of small bowel contents consistent with increased transit time. The dilatation is significantly less than that seen on prior plain film examination. Remainder of the exam is stable. Electronically Signed   By: Alcide Clever M.D.   On: 10/28/2021 20:11   DG Abd 2 Views  Result Date: 10/28/2021 CLINICAL DATA:  Crohn's disease with complication. Bilateral lower abdominal pain with distension and diarrhea for 4-6 days. EXAM: ABDOMEN - 2 VIEW COMPARISON:  Abdominopelvic CT 07/30/2020. FINDINGS: There are multiple mildly dilated loops of small bowel in the mid abdomen with  air-fluid levels on the erect examination, suspicious for a distal partial small bowel obstruction. The colon appears decompressed. No evidence of free intraperitoneal air. Punctate densities projecting over the lower pelvis are likely related to ingested material, not seen on prior CT. There is a mild convex right scoliosis. IMPRESSION: Mild small bowel dilatation with scattered air-fluid levels, suspicious for distal partial small bowel obstruction. Consider CT for further evaluation. Electronically Signed   By: Carey Bullocks M.D.   On: 10/28/2021 14:49    Procedures Procedures    Medications Ordered in ED Medications  sodium chloride 0.9 % bolus 1,000 mL (0 mLs Intravenous Stopped 10/28/21 2123)  ondansetron (ZOFRAN) injection 4 mg (4 mg Intravenous Given 10/28/21 1903)  iohexol (OMNIPAQUE) 300 MG/ML solution 100 mL (100 mLs Intravenous Contrast Given 10/28/21 1951)   methylPREDNISolone sodium succinate (SOLU-MEDROL) 125 mg/2 mL injection 60 mg (60 mg Intravenous Given 10/28/21 2122)    ED Course/ Medical Decision Making/ A&P                           Medical Decision Making Jacqueline Schmidt is a 36 y.o. female here with possible obstruction.  Patient had an outpatient x-ray that showed possible SBO.  Plan to get CBC and CMP and CT abdomen pelvis.  Patient also has diarrhea so we will send for C. difficile and GI pathogen panel.  10:29 PM Reviewed patient's labs and independently interpreted imaging studies.  Labs were unremarkable.  C. difficile is negative.  CT abdomen pelvis showed fatty deposits consistent with Crohn's disease.  Patient has some small bowel dilation but no obvious obstruction.  I discussed case with Dr. Marca Ancona.  She states that is low as patient is able to tolerate p.o., she recommended Solu-Medrol 40 mg IV and she will prescribe oral steroids.  Patient can follow-up outpatient with Dr. Marca Ancona.   Problems Addressed: Crohn's disease of small and large intestines with complication (HCC): acute illness or injury  Amount and/or Complexity of Data Reviewed Labs: ordered. Decision-making details documented in ED Course. Radiology: ordered and independent interpretation performed. Decision-making details documented in ED Course.  Risk Prescription drug management.    Final Clinical Impression(s) / ED Diagnoses Final diagnoses:  None    Rx / DC Orders ED Discharge Orders     None         Charlynne Pander, MD 10/28/21 2233

## 2021-10-28 NOTE — ED Triage Notes (Signed)
Pt arrived via POV, c/o vomiting, diarrhea, concerned for Crohn's flare. States she was seen by GI and told to come to ED due to concerning xray, blood tests and stool testing also done.

## 2021-10-28 NOTE — ED Notes (Signed)
Pt ate apple sauce drank a ginger ale and had water

## 2021-10-28 NOTE — Discharge Instructions (Signed)
You have a Crohn's flare.  You have received a dose of IV steroids in the ED.  Dr. Therisa Doyne plans to prescribe more steroids tomorrow.  Please call her office tomorrow if you do not hear from her  Continue Zofran as prescribed by Dr. Therisa Doyne  Return to ER if you have worse abdominal pain, vomiting, fever

## 2021-10-29 LAB — GASTROINTESTINAL PANEL BY PCR, STOOL (REPLACES STOOL CULTURE)

## 2022-04-27 ENCOUNTER — Encounter: Payer: Medicaid Other | Admitting: Obstetrics and Gynecology

## 2022-06-08 ENCOUNTER — Encounter: Payer: Medicaid Other | Admitting: Obstetrics

## 2022-06-09 ENCOUNTER — Encounter: Payer: Medicaid Other | Admitting: Obstetrics and Gynecology

## 2022-06-23 ENCOUNTER — Encounter: Payer: Medicaid Other | Admitting: Obstetrics and Gynecology

## 2022-08-02 ENCOUNTER — Ambulatory Visit (INDEPENDENT_AMBULATORY_CARE_PROVIDER_SITE_OTHER): Payer: Medicaid Other | Admitting: Obstetrics and Gynecology

## 2022-08-02 ENCOUNTER — Encounter: Payer: Self-pay | Admitting: Obstetrics and Gynecology

## 2022-08-02 ENCOUNTER — Other Ambulatory Visit (HOSPITAL_COMMUNITY)
Admission: RE | Admit: 2022-08-02 | Discharge: 2022-08-02 | Disposition: A | Discharge status: Home / Self Care | Payer: Medicaid Other | Source: Ambulatory Visit | Attending: Obstetrics and Gynecology | Admitting: Obstetrics and Gynecology

## 2022-08-02 VITALS — BP 133/88 | HR 87 | Wt 137.0 lb

## 2022-08-02 DIAGNOSIS — R87615 Unsatisfactory cytologic smear of cervix: Secondary | ICD-10-CM | POA: Insufficient documentation

## 2022-08-02 NOTE — Progress Notes (Signed)
Ms Graig is here for repeat pap smear. Had pap smear collected at [facility] this past March. Unsatisfactory for eval due to lack of cells.  Pt has no GYN complaints  PE AF VSS Chaperone present Lungs clear Heart RRR Abd soft + BS GU NL EGBUS, cervix no lesions, pap smear collected  A/P Unsatisfactory pap smear  Pap smear recollected today. F/U per pap smear results

## 2022-08-06 LAB — CYTOLOGY - PAP
Comment: NEGATIVE
Diagnosis: UNDETERMINED — AB
High risk HPV: NEGATIVE

## 2022-08-16 ENCOUNTER — Encounter (HOSPITAL_BASED_OUTPATIENT_CLINIC_OR_DEPARTMENT_OTHER): Payer: Self-pay

## 2022-08-16 ENCOUNTER — Ambulatory Visit (HOSPITAL_BASED_OUTPATIENT_CLINIC_OR_DEPARTMENT_OTHER): Admit: 2022-08-16 | Payer: Medicaid Other | Admitting: Podiatry

## 2022-08-16 SURGERY — BUNIONECTOMY
Anesthesia: General | Site: Toe | Laterality: Left

## 2022-10-30 IMAGING — CT CT ABD-PELV W/ CM
2 of 4 series · 16 of 46 positions shown, 18 images · IV contrast (omnipaque)
Comparison: None.

CLINICAL DATA: 35-year-old female with epigastric pain.

EXAM:
CT ABDOMEN AND PELVIS WITH CONTRAST
TECHNIQUE: Multidetector CT imaging of the abdomen and pelvis was performed
using the standard protocol following bolus administration of
intravenous contrast.
CONTRAST:  100mL OMNIPAQUE IOHEXOL 300 MG/ML  SOLN

[Series 2: axial st · axial · 0.75mm/px · z∈[+1074,+1448]mm · 13 of 87 slices shown, 15 images]
[im 6/87  soft-tissue]
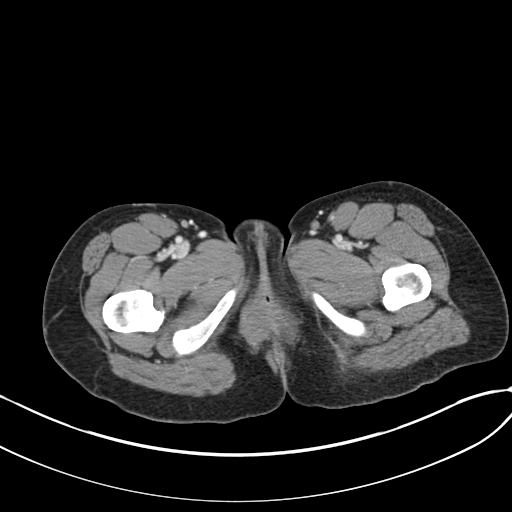
[im 6/87  bone]
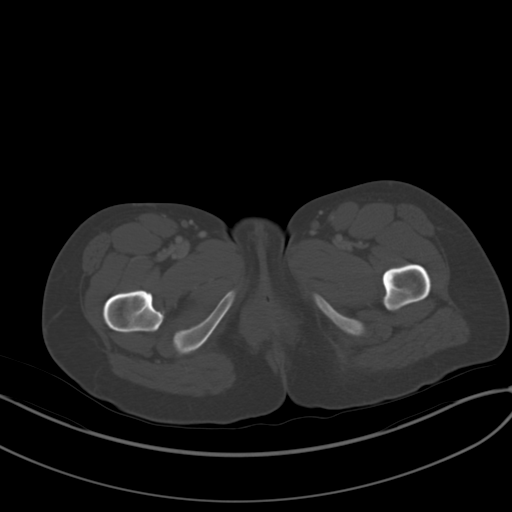
[im 11/87  soft-tissue]
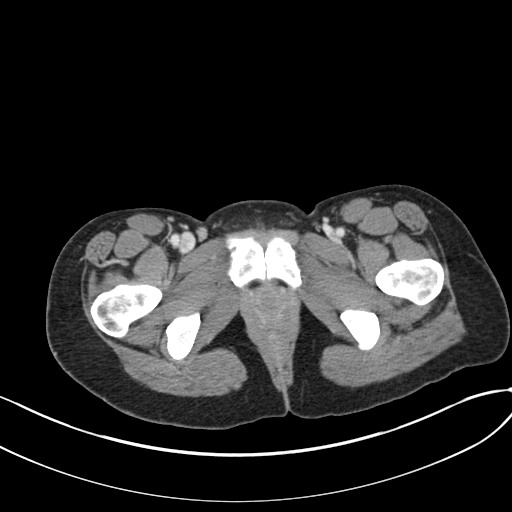
[im 21/87  soft-tissue]
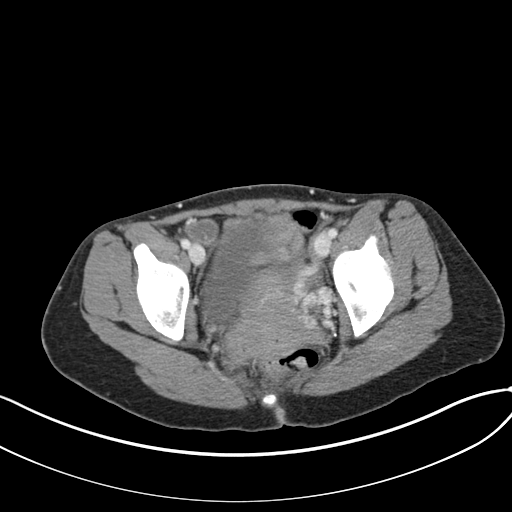
[im 26/87  soft-tissue]
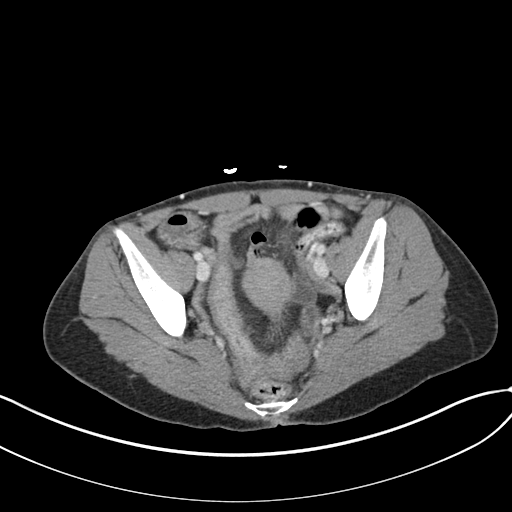
[im 31/87  soft-tissue]
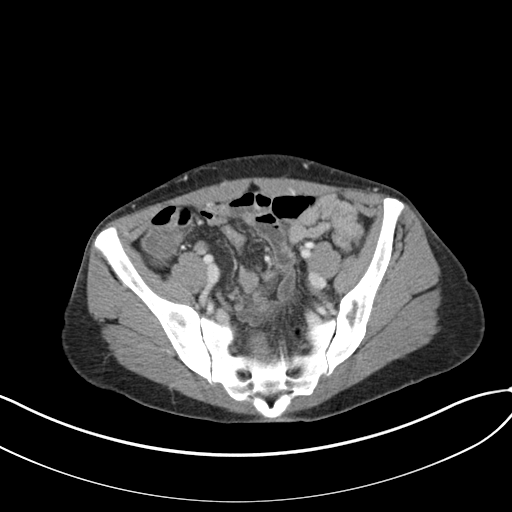
[im 36/87  soft-tissue]
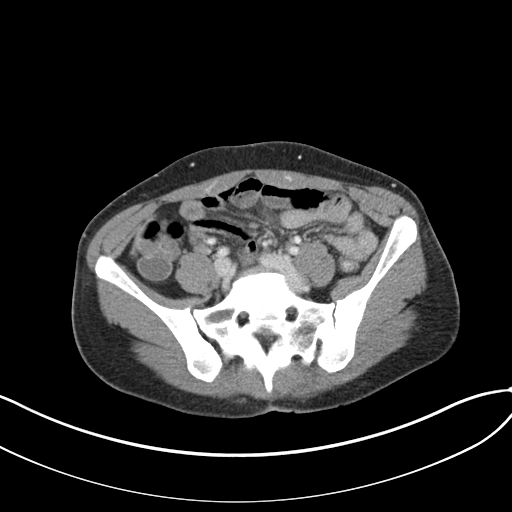
[im 46/87  soft-tissue]
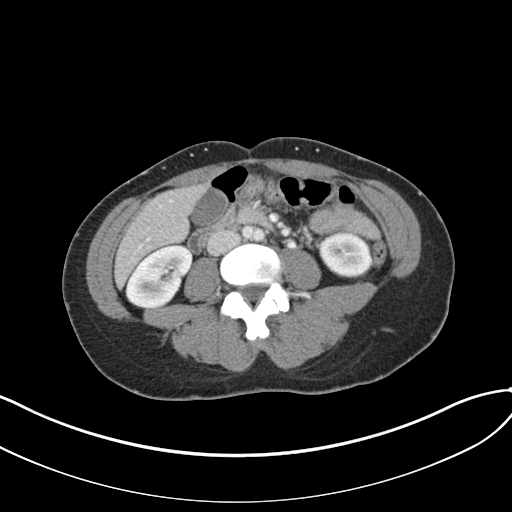
[im 51/87  soft-tissue]
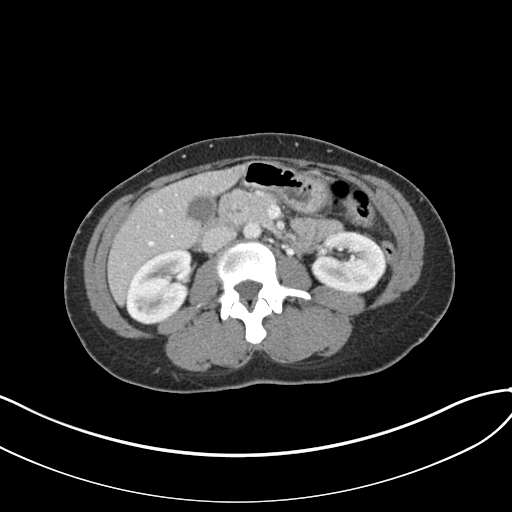
[im 56/87  soft-tissue]
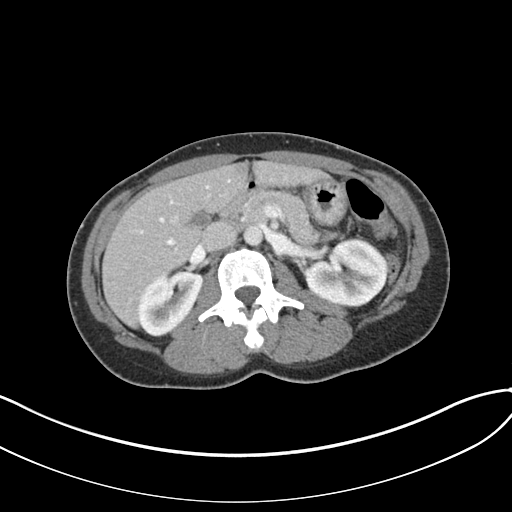
[im 56/87  bone]
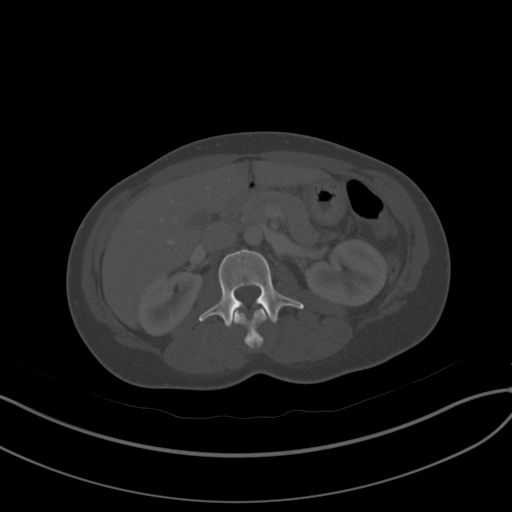
[im 61/87  soft-tissue]
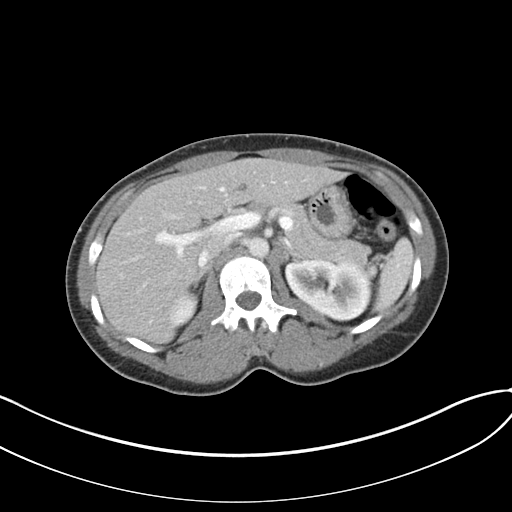
[im 66/87  soft-tissue]
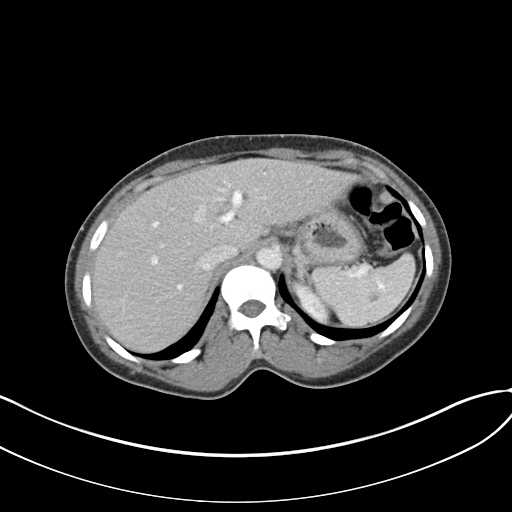
[im 76/87  soft-tissue]
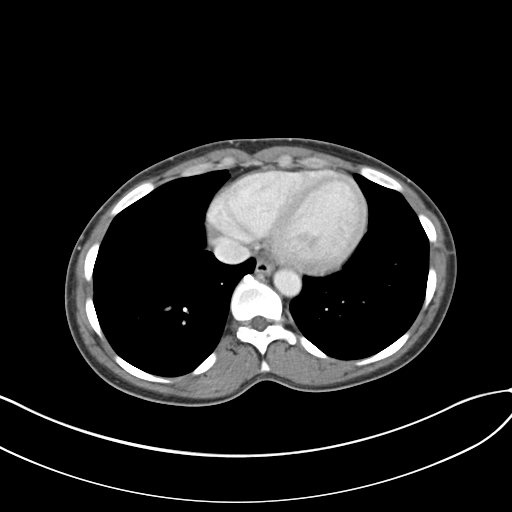
[im 81/87  soft-tissue]
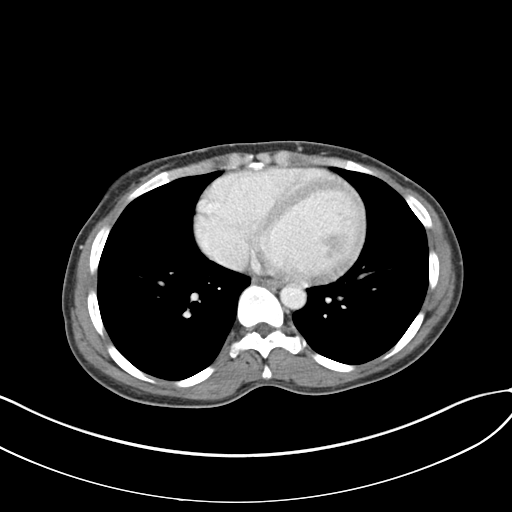

[Series 4: coronal st · coronal · 0.65mm/px · 3 of 111 slices shown]
[im 37/111  soft-tissue]
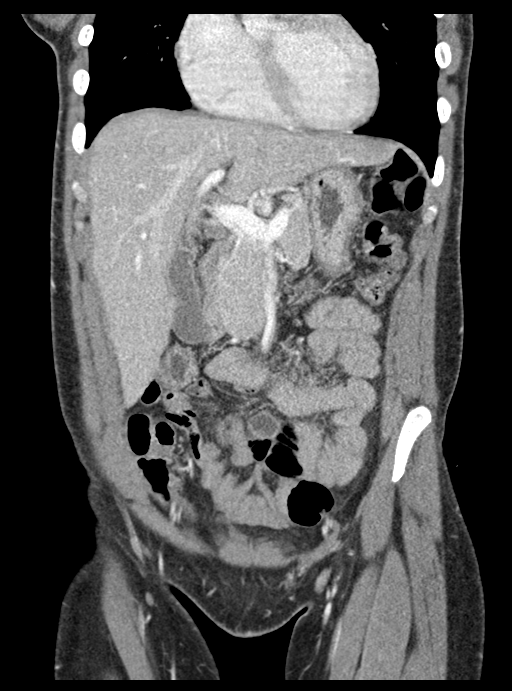
[im 49/111  soft-tissue]
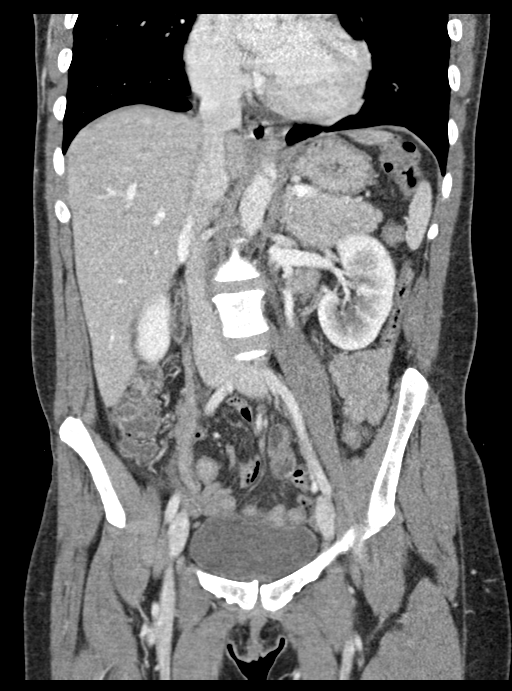
[im 62/111  soft-tissue]
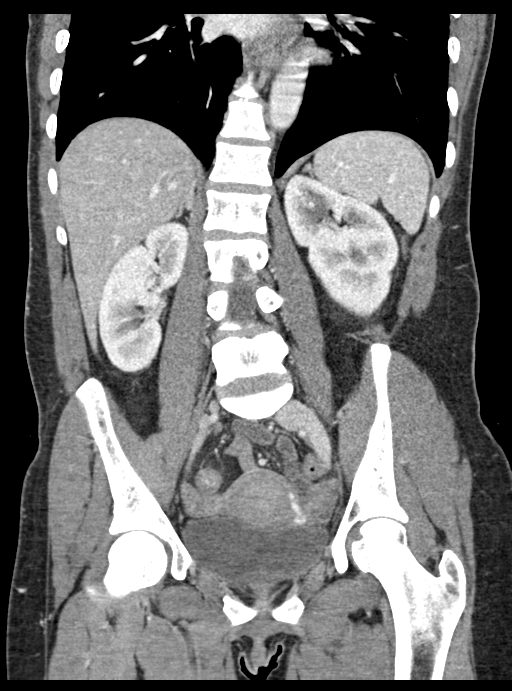

[16 of 46 positions shown; findings below may reference images not displayed]

FINDINGS: Lower chest: The visualized lung bases are clear.

No intra-abdominal free air. No free fluid.

Hepatobiliary: No focal liver abnormality is seen. No gallstones,
gallbladder wall thickening, or biliary dilatation.

Pancreas: Unremarkable. No pancreatic ductal dilatation or
surrounding inflammatory changes.

Spleen: Subcentimeter hypodense lesion in the spleen may represent a
small hemangioma or cyst versus interspersed fat.

Adrenals/Urinary Tract: The adrenal glands unremarkable. The
kidneys, visualized ureters, and urinary bladder appear
unremarkable.

Stomach/Bowel: Evaluation of the bowel is limited in the absence of
oral contrast. There is inflammatory changes of the distal small
bowel and terminal ileum consistent with enteritis and suspicious
for inflammatory bowel disease. Correlation with history of Crohn's
disease recommended. There is no bowel obstruction. The appendix is
normal.

Vascular/Lymphatic: The abdominal aorta and IVC are unremarkable. No
portal venous gas. There is no adenopathy.

Reproductive: The uterus is anteverted and grossly unremarkable. No
adnexal masses. Mildly dilated left gonadal vein.

Other: None

Musculoskeletal: Mild bilateral sacroiliitis. No acute osseous
pathology.
IMPRESSION: Inflammatory changes of the distal small bowel and terminal ileum
suspicious for Crohn's disease. Clinical correlation is recommended.
No bowel obstruction. Normal appendix.

## 2023-03-28 ENCOUNTER — Other Ambulatory Visit: Payer: Self-pay | Admitting: Physician Assistant

## 2023-03-28 DIAGNOSIS — Z1231 Encounter for screening mammogram for malignant neoplasm of breast: Secondary | ICD-10-CM

## 2023-04-19 ENCOUNTER — Ambulatory Visit: Payer: Medicaid Other

## 2023-04-21 ENCOUNTER — Ambulatory Visit
Admission: RE | Admit: 2023-04-21 | Discharge: 2023-04-21 | Discharge status: Home / Self Care | Source: Ambulatory Visit | Attending: Physician Assistant | Admitting: Physician Assistant

## 2023-04-21 DIAGNOSIS — Z1231 Encounter for screening mammogram for malignant neoplasm of breast: Secondary | ICD-10-CM

## 2023-04-27 ENCOUNTER — Other Ambulatory Visit: Payer: Self-pay | Admitting: Physician Assistant

## 2023-04-27 DIAGNOSIS — R928 Other abnormal and inconclusive findings on diagnostic imaging of breast: Secondary | ICD-10-CM

## 2023-05-10 ENCOUNTER — Ambulatory Visit
Admission: RE | Admit: 2023-05-10 | Discharge: 2023-05-10 | Disposition: A | Discharge status: Home / Self Care | Source: Ambulatory Visit | Attending: Physician Assistant | Admitting: Physician Assistant

## 2023-05-10 DIAGNOSIS — R928 Other abnormal and inconclusive findings on diagnostic imaging of breast: Secondary | ICD-10-CM

## 2023-11-24 ENCOUNTER — Other Ambulatory Visit: Payer: Self-pay | Admitting: Gastroenterology

## 2023-11-24 DIAGNOSIS — R194 Change in bowel habit: Secondary | ICD-10-CM

## 2023-11-24 DIAGNOSIS — K50919 Crohn's disease, unspecified, with unspecified complications: Secondary | ICD-10-CM

## 2023-11-24 DIAGNOSIS — R109 Unspecified abdominal pain: Secondary | ICD-10-CM

## 2023-12-05 ENCOUNTER — Ambulatory Visit
Admission: RE | Admit: 2023-12-05 | Discharge: 2023-12-05 | Disposition: A | Discharge status: Home / Self Care | Source: Ambulatory Visit | Attending: Gastroenterology | Admitting: Gastroenterology

## 2023-12-05 DIAGNOSIS — K50919 Crohn's disease, unspecified, with unspecified complications: Secondary | ICD-10-CM

## 2023-12-05 DIAGNOSIS — R194 Change in bowel habit: Secondary | ICD-10-CM

## 2023-12-05 DIAGNOSIS — R109 Unspecified abdominal pain: Secondary | ICD-10-CM

## 2023-12-05 MED ORDER — IOPAMIDOL (ISOVUE-300) INJECTION 61%
100.0000 mL | Freq: Once | INTRAVENOUS | Status: AC | PRN
  Administered 2023-12-05: 100 mL via INTRAVENOUS
